# Patient Record
Sex: Male | Born: 1935 | Race: White | Hispanic: No | Marital: Married | Smoking: Former smoker
Health system: Southern US, Community
[De-identification: ages and names within clinical notes are randomized; demographics above are authoritative.]

## PROBLEM LIST (undated history)

## (undated) DIAGNOSIS — C679 Malignant neoplasm of bladder, unspecified: Secondary | ICD-10-CM

## (undated) DIAGNOSIS — M810 Age-related osteoporosis without current pathological fracture: Secondary | ICD-10-CM

## (undated) DIAGNOSIS — R011 Cardiac murmur, unspecified: Secondary | ICD-10-CM

## (undated) DIAGNOSIS — M21371 Foot drop, right foot: Secondary | ICD-10-CM

## (undated) DIAGNOSIS — I10 Essential (primary) hypertension: Secondary | ICD-10-CM

## (undated) DIAGNOSIS — E7800 Pure hypercholesterolemia, unspecified: Secondary | ICD-10-CM

## (undated) DIAGNOSIS — C6290 Malignant neoplasm of unspecified testis, unspecified whether descended or undescended: Secondary | ICD-10-CM

## (undated) DIAGNOSIS — N189 Chronic kidney disease, unspecified: Secondary | ICD-10-CM

## (undated) HISTORY — PX: TONSILLECTOMY: SUR1361

## (undated) HISTORY — PX: OTHER SURGICAL HISTORY: SHX169

## (undated) HISTORY — PX: CATARACT EXTRACTION: SUR2

---

## 1963-02-26 HISTORY — PX: ORCHIECTOMY: SHX2116

## 2004-02-26 HISTORY — PX: OTHER SURGICAL HISTORY: SHX169

## 2005-11-18 ENCOUNTER — Encounter: Payer: Self-pay | Admitting: Endocrinology

## 2005-11-19 ENCOUNTER — Encounter: Payer: Self-pay | Admitting: Endocrinology

## 2005-12-16 ENCOUNTER — Encounter: Payer: Self-pay | Admitting: Endocrinology

## 2006-05-26 ENCOUNTER — Encounter: Payer: Self-pay | Admitting: Endocrinology

## 2006-06-17 ENCOUNTER — Encounter: Payer: Self-pay | Admitting: Endocrinology

## 2006-06-25 ENCOUNTER — Encounter: Payer: Self-pay | Admitting: Endocrinology

## 2007-01-13 ENCOUNTER — Encounter: Payer: Self-pay | Admitting: Endocrinology

## 2007-03-31 ENCOUNTER — Encounter: Payer: Self-pay | Admitting: Endocrinology

## 2007-05-28 ENCOUNTER — Ambulatory Visit: Payer: Self-pay | Admitting: Endocrinology

## 2007-05-28 DIAGNOSIS — I739 Peripheral vascular disease, unspecified: Secondary | ICD-10-CM | POA: Insufficient documentation

## 2007-05-28 DIAGNOSIS — S7290XA Unspecified fracture of unspecified femur, initial encounter for closed fracture: Secondary | ICD-10-CM | POA: Insufficient documentation

## 2007-05-28 DIAGNOSIS — I10 Essential (primary) hypertension: Secondary | ICD-10-CM | POA: Insufficient documentation

## 2007-05-28 DIAGNOSIS — N259 Disorder resulting from impaired renal tubular function, unspecified: Secondary | ICD-10-CM | POA: Insufficient documentation

## 2007-05-28 DIAGNOSIS — C6290 Malignant neoplasm of unspecified testis, unspecified whether descended or undescended: Secondary | ICD-10-CM | POA: Insufficient documentation

## 2007-05-28 DIAGNOSIS — M109 Gout, unspecified: Secondary | ICD-10-CM

## 2007-05-28 DIAGNOSIS — E785 Hyperlipidemia, unspecified: Secondary | ICD-10-CM

## 2007-06-17 ENCOUNTER — Ambulatory Visit: Payer: Self-pay

## 2007-06-18 ENCOUNTER — Ambulatory Visit: Payer: Self-pay | Admitting: Endocrinology

## 2007-06-22 ENCOUNTER — Encounter: Payer: Self-pay | Admitting: Endocrinology

## 2007-07-22 ENCOUNTER — Ambulatory Visit: Payer: Self-pay | Admitting: Endocrinology

## 2007-08-21 ENCOUNTER — Ambulatory Visit: Payer: Self-pay | Admitting: Endocrinology

## 2007-09-08 ENCOUNTER — Ambulatory Visit: Payer: Self-pay | Admitting: Endocrinology

## 2007-10-08 ENCOUNTER — Ambulatory Visit: Payer: Self-pay | Admitting: Endocrinology

## 2007-10-21 ENCOUNTER — Encounter: Payer: Self-pay | Admitting: Endocrinology

## 2007-11-09 ENCOUNTER — Ambulatory Visit: Payer: Self-pay | Admitting: Endocrinology

## 2007-11-25 ENCOUNTER — Encounter: Payer: Self-pay | Admitting: Endocrinology

## 2007-11-30 ENCOUNTER — Telehealth (INDEPENDENT_AMBULATORY_CARE_PROVIDER_SITE_OTHER): Payer: Self-pay | Admitting: *Deleted

## 2007-12-01 ENCOUNTER — Ambulatory Visit: Payer: Self-pay | Admitting: Endocrinology

## 2008-01-08 ENCOUNTER — Ambulatory Visit: Payer: Self-pay | Admitting: Endocrinology

## 2008-02-08 ENCOUNTER — Ambulatory Visit: Payer: Self-pay | Admitting: Endocrinology

## 2008-02-10 ENCOUNTER — Telehealth (INDEPENDENT_AMBULATORY_CARE_PROVIDER_SITE_OTHER): Payer: Self-pay | Admitting: *Deleted

## 2008-03-22 ENCOUNTER — Ambulatory Visit: Payer: Self-pay | Admitting: Endocrinology

## 2008-03-29 LAB — CONVERTED CEMR LAB: Hgb A1c MFr Bld: 8.1 % — ABNORMAL HIGH (ref 4.6–6.0)

## 2008-04-19 ENCOUNTER — Ambulatory Visit: Payer: Self-pay | Admitting: Endocrinology

## 2008-04-28 ENCOUNTER — Encounter: Payer: Self-pay | Admitting: Internal Medicine

## 2008-05-09 ENCOUNTER — Encounter: Payer: Self-pay | Admitting: Endocrinology

## 2008-05-12 ENCOUNTER — Ambulatory Visit: Payer: Self-pay | Admitting: Endocrinology

## 2008-07-14 ENCOUNTER — Ambulatory Visit: Payer: Self-pay | Admitting: Endocrinology

## 2008-07-14 LAB — CONVERTED CEMR LAB: Hgb A1c MFr Bld: 6.4 % (ref 4.6–6.5)

## 2008-10-26 ENCOUNTER — Ambulatory Visit: Payer: Self-pay | Admitting: Endocrinology

## 2008-10-26 DIAGNOSIS — E1029 Type 1 diabetes mellitus with other diabetic kidney complication: Secondary | ICD-10-CM | POA: Insufficient documentation

## 2008-10-26 LAB — CONVERTED CEMR LAB
Creatinine,U: 113.3 mg/dL
Hgb A1c MFr Bld: 6.2 % (ref 4.6–6.5)
Microalb Creat Ratio: 149.2 mg/g — ABNORMAL HIGH (ref 0.0–30.0)
Microalb, Ur: 16.9 mg/dL — ABNORMAL HIGH (ref 0.0–1.9)

## 2008-11-09 ENCOUNTER — Encounter: Payer: Self-pay | Admitting: Endocrinology

## 2009-01-25 ENCOUNTER — Ambulatory Visit: Payer: Self-pay | Admitting: Endocrinology

## 2009-01-25 LAB — CONVERTED CEMR LAB: Hgb A1c MFr Bld: 6.1 % (ref 4.6–6.5)

## 2009-04-17 ENCOUNTER — Encounter: Payer: Self-pay | Admitting: Endocrinology

## 2009-04-21 ENCOUNTER — Telehealth: Payer: Self-pay | Admitting: Endocrinology

## 2009-04-25 ENCOUNTER — Encounter: Payer: Self-pay | Admitting: Endocrinology

## 2009-04-27 ENCOUNTER — Ambulatory Visit: Payer: Self-pay | Admitting: Endocrinology

## 2009-04-28 ENCOUNTER — Encounter: Payer: Self-pay | Admitting: Endocrinology

## 2009-05-09 ENCOUNTER — Encounter: Payer: Self-pay | Admitting: Endocrinology

## 2009-05-22 ENCOUNTER — Encounter (INDEPENDENT_AMBULATORY_CARE_PROVIDER_SITE_OTHER): Payer: Self-pay | Admitting: *Deleted

## 2009-05-22 ENCOUNTER — Encounter: Payer: Self-pay | Admitting: Endocrinology

## 2009-05-22 LAB — CONVERTED CEMR LAB
BUN: 57 mg/dL
Basophils Relative: 0 %
CO2: 30 meq/L
Calcium: 9.6 mg/dL
Chloride: 104 meq/L
Cholesterol: 155 mg/dL
Creatinine, Ser: 2.1 mg/dL
Direct LDL: 60 mg/dL
Eosinophils Relative: 6.2 %
GFR calc Af Amer: 37.75 mL/min
GFR calc non Af Amer: 31.2 mL/min
Glucose, Bld: 188 mg/dL
HCT: 41.6 %
HDL: 74 mg/dL
Hemoglobin: 13.4 g/dL
Lymphocytes, automated: 11.1 %
MCV: 97.4 fL
Monocytes Relative: 5.5 %
Neutrophils Relative %: 76.8 %
Platelets: 145 10*3/uL
Potassium: 5 meq/L
RBC: 4.27 M/uL
RDW: 13.6 %
Sodium: 141 meq/L
Triglyceride fasting, serum: 115 mg/dL
WBC: 9.1 10*3/uL

## 2009-05-23 ENCOUNTER — Encounter (INDEPENDENT_AMBULATORY_CARE_PROVIDER_SITE_OTHER): Payer: Self-pay | Admitting: *Deleted

## 2009-05-31 ENCOUNTER — Ambulatory Visit: Payer: Self-pay | Admitting: Endocrinology

## 2009-05-31 LAB — CONVERTED CEMR LAB: Hgb A1c MFr Bld: 6.4 % (ref 4.6–6.5)

## 2009-07-25 ENCOUNTER — Encounter: Payer: Self-pay | Admitting: Endocrinology

## 2009-08-26 ENCOUNTER — Emergency Department (HOSPITAL_COMMUNITY): Admission: EM | Admit: 2009-08-26 | Discharge: 2009-08-26 | Payer: Self-pay | Admitting: Emergency Medicine

## 2009-09-28 ENCOUNTER — Ambulatory Visit: Payer: Self-pay | Admitting: Endocrinology

## 2009-09-28 LAB — CONVERTED CEMR LAB: Hgb A1c MFr Bld: 6.4 % (ref 4.6–6.5)

## 2010-01-30 ENCOUNTER — Ambulatory Visit: Payer: Self-pay | Admitting: Endocrinology

## 2010-01-30 LAB — CONVERTED CEMR LAB: Hgb A1c MFr Bld: 7.3 % — ABNORMAL HIGH (ref 4.6–6.5)

## 2010-02-27 ENCOUNTER — Encounter (INDEPENDENT_AMBULATORY_CARE_PROVIDER_SITE_OTHER): Payer: Self-pay | Admitting: Urology

## 2010-02-27 ENCOUNTER — Ambulatory Visit (HOSPITAL_COMMUNITY)
Admission: RE | Admit: 2010-02-27 | Discharge: 2010-02-28 | Payer: Self-pay | Source: Home / Self Care | Attending: Urology | Admitting: Urology

## 2010-02-28 LAB — BASIC METABOLIC PANEL
BUN: 91 mg/dL — ABNORMAL HIGH (ref 6–23)
CO2: 29 mEq/L (ref 19–32)
Calcium: 9 mg/dL (ref 8.4–10.5)
Chloride: 101 mEq/L (ref 96–112)
Creatinine, Ser: 2.93 mg/dL — ABNORMAL HIGH (ref 0.4–1.5)
GFR calc Af Amer: 26 mL/min — ABNORMAL LOW (ref >60)
GFR calc non Af Amer: 21 mL/min — ABNORMAL LOW (ref >60)
Glucose, Bld: 286 mg/dL — ABNORMAL HIGH (ref 70–99)
Potassium: 5 mEq/L (ref 3.5–5.1)
Sodium: 138 mEq/L (ref 135–145)

## 2010-02-28 LAB — CBC
HCT: 27.2 % — ABNORMAL LOW (ref 39.0–52.0)
Hemoglobin: 9.1 g/dL — ABNORMAL LOW (ref 13.0–17.0)
MCH: 32.6 pg (ref 26.0–34.0)
MCHC: 33.5 g/dL (ref 30.0–36.0)
MCV: 97.5 fL (ref 78.0–100.0)
Platelets: 93 10*3/uL — ABNORMAL LOW (ref 150–400)
RBC: 2.79 MIL/uL — ABNORMAL LOW (ref 4.22–5.81)
RDW: 13.9 % (ref 11.5–15.5)
WBC: 9.4 10*3/uL (ref 4.0–10.5)

## 2010-02-28 LAB — GLUCOSE, CAPILLARY: Glucose-Capillary: 253 mg/dL — ABNORMAL HIGH (ref 70–99)

## 2010-03-27 NOTE — Medication Information (Signed)
Summary: Humalog Approved/[employer]  Humalog Approved/[employer]   Imported By: Sherian Rein 05/08/2009 14:17:48  _____________________________________________________________________  External Attachment:    Type:   Image     Comment:   External Document

## 2010-03-27 NOTE — Assessment & Plan Note (Signed)
Summary: 4 MTH FU  STC   Vital Signs:  Patient profile:   76 year old male Height:      72 inches (182.88 cm) Weight:      146.13 pounds (66.42 kg) O2 Sat:      97 % on Room air Temp:     96.7 degrees F (35.94 degrees C) oral Pulse rate:   66 / minute BP sitting:   172 / 78  (left arm) Cuff size:   regular  Vitals Entered By: Josph Macho RMA (May 31, 2009 10:04 AM)  O2 Flow:  Room air CC: 4 month follow up/ pt states he was taking Amlodipine for BP but quit taking it/ CF Is Patient Diabetic? Yes   Referring Provider:  Hyacinth Meeker Primary Provider:  Sigmund Hazel, eagle  CC:  4 month follow up/ pt states he was taking Amlodipine for BP but quit taking it/ CF.  History of Present Illness: pt states he feels well in general.  he brings a record of his cbg's which i have reviewed today.  all are in the low to mid-100's.    Current Medications (verified): 1)  Atenolol 100 Mg  Tabs (Atenolol) .... Take 1 By Mouth Qd 2)  Adult Aspirin Low Strength 81 Mg  Tbdp (Aspirin) .... Take 1 By Mouth Qd 3)  Allopurinol 100 Mg  Tabs (Allopurinol) .... Take 1 By Mouth Qd 4)  Calcitriol 0.25 Mcg  Caps (Calcitriol) .... Take 1 By Mouth Qd 5)  Vitamin B-12 1000 Mcg  Tabs (Cyanocobalamin) .... Take 1 By Mouth Qd 6)  Pen Needles, and Brand and Size .... Tid 7)  Simvastatin 20 Mg  Tabs (Simvastatin) .... Take 1 By Mouth Qd 8)  Humalog 100 Unit/ml Soln (Insulin Lispro (Human)) .... Inject 17-6-18units Three Times A Day  Allergies (verified): No Known Drug Allergies  Past History:  Past Medical History: Last updated: 05/28/2007 Diabetes mellitus, type I Gout Hyperlipidemia Hypertension Renal insufficiency  Review of Systems  The patient denies hypoglycemia.    Physical Exam  General:  normal appearance.   Pulses:  dorsalis pedis intact bilat.    Extremities:  no deformity.  no ulcer on the feet.  feet are of normal color and temp.  no edema.  Neurologic:  sensation is intact to  touch on the feet.  Additional Exam:  Hemoglobin A1C            6.4 %    Impression & Recommendations:  Problem # 1:  DIABETES MELLITUS, TYPE I, WITH RENAL COMPLICATIONS (ICD-250.41) still overcontrolled  Medications Added to Medication List This Visit: 1)  Humalog 100 Unit/ml Soln (Insulin lispro (human)) .... Inject 16-5-17units three times a day  Other Orders: TLB-A1C / Hgb A1C (Glycohemoglobin) (83036-A1C) Est. Patient Level III (21308)  Patient Instructions: 1)  Please schedule a follow-up appointment in 4 months. 2)  tests are being ordered for you today.  a few days after the test(s), please call [phone] to hear your test results.  3)  pending the test results, please continue humalog to (just before each meal) 17-6-18 units).   4)  (update: i left message on phone-tree:  reduce humalog to (just before each meal), 16-5-17 units)

## 2010-03-27 NOTE — Miscellaneous (Signed)
Summary: labs  Clinical Lists Changes  Observations: Added new observation of LDL DIR: 60 mg/dL (16/11/9602 54:09) Added new observation of HDL: 74 mg/dL (81/19/1478 29:56) Added new observation of TRIGLYCERIDE: 115 mg/dL (21/30/8657 84:69) Added new observation of CHOLESTEROL: 155 mg/dL (62/95/2841 32:44) Added new observation of GFRAA: 37.75 mL/min (05/22/2009 16:30) Added new observation of GFR: 31.20 mL/min (05/22/2009 16:30) Added new observation of CALCIUM: 9.6 mg/dL (02/27/7251 66:44) Added new observation of CO2 PLSM/SER: 30 meq/L (05/22/2009 16:30) Added new observation of CL SERUM: 104 meq/L (05/22/2009 16:30) Added new observation of K SERUM: 5.0 meq/L (05/22/2009 16:30) Added new observation of NA: 141 meq/L (05/22/2009 16:30) Added new observation of CREATININE: 2.10 mg/dL (03/47/4259 56:38) Added new observation of BUN: 57 mg/dL (75/64/3329 51:88) Added new observation of BG RANDOM: 188 mg/dL (41/66/0630 16:01) Added new observation of BASOPHIL %: 0.00 % (05/22/2009 16:29) Added new observation of % EOS AUTO: 6.20 % (05/22/2009 16:29) Added new observation of MONOCYTE %: 5.5 % (05/22/2009 16:29) Added new observation of LYMPH %: 11.1 % (05/22/2009 16:29) Added new observation of PMN %: 76.8 % (05/22/2009 16:29) Added new observation of RDW: 13.6 % (05/22/2009 16:29) Added new observation of MCV: 97.4 fL (05/22/2009 16:29) Added new observation of PLATELETK/UL: 145 K/uL (05/22/2009 16:29) Added new observation of RBC M/UL: 4.27 M/uL (05/22/2009 16:29) Added new observation of HCT: 41.6 % (05/22/2009 16:29) Added new observation of HGB: 13.4 g/dL (09/32/3557 32:20) Added new observation of WBC COUNT: 9.1 10*3/microliter (05/22/2009 16:29)      -  Date:  05/22/2009    WBC: 9.1    HGB: 13.4    HCT: 41.6    RBC: 4.27    PLT: 145    MCV: 97.4    RDW: 13.6    Neutrophil: 76.8    Lymphs: 11.1    Monos: 5.5    Eos: 6.20    Basophil: 0.00    BG Random: 188  BUN: 57    Creatinine: 2.10    Sodium: 141    Potassium: 5.0    Chloride: 104    CO2 Total: 30    Calcium: 9.6    GFR(Non African American): 31.20    GFR(African American): 37.75    Total Chol: 155    TG: 115    HDL: 74    Direct LDL: 60

## 2010-03-27 NOTE — Assessment & Plan Note (Signed)
Summary: 4 MO ROV /NWS  #   Vital Signs:  Patient profile:   76 year old male Height:      72 inches (182.88 cm) Weight:      138 pounds (62.73 kg) BMI:     18.78 O2 Sat:      97 % on Room air Temp:     98.6 degrees F (37.00 degrees C) oral Pulse rate:   69 / minute Pulse rhythm:   regular BP sitting:   124 / 62  (left arm) Cuff size:   regular  Vitals Entered By: Brenton Grills CMA Duncan Dull) (January 30, 2010 9:58 AM)  O2 Flow:  Room air CC: 4 month F/U/aj Is Patient Diabetic? Yes   Referring Provider:  Hyacinth Meeker Primary Provider:  Sigmund Hazel, eagle  CC:  4 month F/U/aj.  History of Present Illness: pt is worried that he continues to lost weight.  he brings a record of his cbg's which i have reviewed today.  most are in the low to mid-100's.  it is highest in am (sometimes over 200), although it is sometimes below 100 in am (90).  Current Medications (verified): 1)  Adult Aspirin Low Strength 81 Mg  Tbdp (Aspirin) .... Take 1 By Mouth Qd 2)  Allopurinol 100 Mg  Tabs (Allopurinol) .... Take 1 By Mouth Qd 3)  Calcitriol 0.25 Mcg  Caps (Calcitriol) .... Take 1 By Mouth Qd 4)  Vitamin B-12 1000 Mcg  Tabs (Cyanocobalamin) .... Take 1 By Mouth Qd 5)  Pen Needles, and Brand and Size .... Tid 6)  Simvastatin 20 Mg  Tabs (Simvastatin) .... Take 1 By Mouth Qd 7)  Humalog Kwikpen 100 Unit/ml Soln (Insulin Lispro (Human)) .... Three Times A Day (Just Before Each Meal), 14-5-17 Units. 8)  Amlodipine Besylate 10 Mg Tabs (Amlodipine Besylate) .Marland Kitchen.. 1 Tablet Once Daily 9)  Metoprolol Tartrate 100 Mg Tabs (Metoprolol Tartrate) .... Take 1 By Mouth Once Daily 10)  Furosemide 40 Mg Tabs (Furosemide) .Marland Kitchen.. 1 Tablet By Mouth Once Daily  Allergies (verified): No Known Drug Allergies  Past History:  Past Medical History: Last updated: 05/28/2007 Diabetes mellitus, type I Gout Hyperlipidemia Hypertension Renal insufficiency  Review of Systems  The patient denies hypoglycemia.     Physical Exam  General:  normal appearance.   Pulses:  dorsalis pedis intact bilat.    Extremities:  no deformity.  no ulcer on the feet.  feet are of normal color and temp.   1+ right pedal edema and trace left pedal edema.   Neurologic:  sensation is intact to touch on the feet, but decreased from normal on the right foot.    Additional Exam:  Hemoglobin A1C       [H]  7.3 %    Impression & Recommendations:  Problem # 1:  DIABETES MELLITUS, TYPE I, WITH RENAL COMPLICATIONS (ICD-250.41) the pattern of his cbg's suggests he is losing basal insulin function  Medications Added to Medication List This Visit: 1)  Furosemide 40 Mg Tabs (Furosemide) .Marland Kitchen.. 1 tablet by mouth once daily  Other Orders: TLB-A1C / Hgb A1C (Glycohemoglobin) (83036-A1C) Est. Patient Level III (16109)  Patient Instructions: 1)  Please schedule a follow-up appointment in 4 months. 2)  tests are being ordered for you today.  please call [phone] to hear your test results.  3)  pending the test results, continue humalog (just before each meal), 14-5-17 units).  also, take 2 units of humalog at bedtime, if you eat a snack  then. 4)  please see dr Hyacinth Meeker to see why you continue to lose weight.   5)  (update: i left message on phone-tree:  rx as we discussed.  same insulin for now) Prescriptions: HUMALOG KWIKPEN 100 UNIT/ML SOLN (INSULIN LISPRO (HUMAN)) three times a day (just before each meal), 14-5-17 units.  #1box x 3   Entered and Authorized by:   Minus Breeding MD   Signed by:   Minus Breeding MD on 01/30/2010   Method used:   Print then Give to Patient   RxID:   [id number] HUMALOG KWIKPEN 100 UNIT/ML SOLN (INSULIN LISPRO (HUMAN)) three times a day (just before each meal), 14-5-17 units.  #1box x 3   Entered and Authorized by:   Minus Breeding MD   Signed by:   Minus Breeding MD on 01/30/2010   Method used:   Print then Give to Patient   RxID:   [id number]    Orders Added: 1)  TLB-A1C / Hgb  A1C (Glycohemoglobin) [83036-A1C] 2)  Est. Patient Level III [84696]

## 2010-03-27 NOTE — Letter (Signed)
Summary: [facility]   Imported By: Sherian Rein 05/17/2009 10:26:53  _____________________________________________________________________  External Attachment:    Type:   Image     Comment:   External Document

## 2010-03-27 NOTE — Assessment & Plan Note (Signed)
Summary: PER PT 4 MTH FU  STC   Vital Signs:  Patient profile:   76 year old male Height:      72 inches (182.88 cm) Weight:      142.38 pounds (64.72 kg) BMI:     19.38 O2 Sat:      96 % on Room air Temp:     97.1 degrees F (36.17 degrees C) oral Pulse rate:   61 / minute BP sitting:   130 / 66  (left arm) Cuff size:   regular  Vitals Entered By: Brenton Grills MA (September 28, 2009 9:46 AM)  O2 Flow:  Room air CC: 4 mo F/U/pt states he is not currently taking Atenolol/aj Is Patient Diabetic? Yes   Referring Hykeem Ojeda:  Hyacinth Meeker Primary Marsha Gundlach:  Sigmund Hazel, eagle  CC:  4 mo F/U/pt states he is not currently taking Atenolol/aj.  History of Present Illness: pt states he feels well in general, except for a recent fall.  he brings a record of his cbg's which i have reviewed today.  all are in the 100's, except occasional 50-60, before lunch.    Current Medications (verified): 1)  Atenolol 100 Mg  Tabs (Atenolol) .... Take 1 By Mouth Qd 2)  Adult Aspirin Low Strength 81 Mg  Tbdp (Aspirin) .... Take 1 By Mouth Qd 3)  Allopurinol 100 Mg  Tabs (Allopurinol) .... Take 1 By Mouth Qd 4)  Calcitriol 0.25 Mcg  Caps (Calcitriol) .... Take 1 By Mouth Qd 5)  Vitamin B-12 1000 Mcg  Tabs (Cyanocobalamin) .... Take 1 By Mouth Qd 6)  Pen Needles, and Brand and Size .... Tid 7)  Simvastatin 20 Mg  Tabs (Simvastatin) .... Take 1 By Mouth Qd 8)  Humalog Kwikpen 100 Unit/ml Soln (Insulin Lispro (Human)) .Marland Kitchen.. 16-5-17 9)  Amlodipine Besylate 10 Mg Tabs (Amlodipine Besylate) .Marland Kitchen.. 1 Tablet Once Daily 10)  Metoprolol Tartrate 100 Mg Tabs (Metoprolol Tartrate) .... Take 1 By Mouth Once Daily  Allergies (verified): No Known Drug Allergies  Past History:  Past Medical History: Last updated: 05/28/2007 Diabetes mellitus, type I Gout Hyperlipidemia Hypertension Renal insufficiency  Review of Systems  The patient denies syncope.    Physical Exam  General:  normal appearance.   Skin:   insulin injection sites at anterior abdomen are normal    Impression & Recommendations:  Problem # 1:  DIABETES MELLITUS, TYPE I, WITH RENAL COMPLICATIONS (ICD-250.41) overcontrolled  Medications Added to Medication List This Visit: 1)  Humalog Kwikpen 100 Unit/ml Soln (Insulin lispro (human)) .... Three times a day (just before each meal), 15-5-17 units. 2)  Humalog Kwikpen 100 Unit/ml Soln (Insulin lispro (human)) .... Three times a day (just before each meal), 14-5-17 units. 3)  Amlodipine Besylate 10 Mg Tabs (Amlodipine besylate) .Marland Kitchen.. 1 tablet once daily 4)  Metoprolol Tartrate 100 Mg Tabs (Metoprolol tartrate) .... Take 1 by mouth once daily  Other Orders: TLB-A1C / Hgb A1C (Glycohemoglobin) (83036-A1C) Est. Patient Level III (16109)  Patient Instructions: 1)  Please schedule a follow-up appointment in 4 months. 2)  tests are being ordered for you today.  a few days after the test(s), please call [phone] to hear your test results.  3)  pending the test results, reduce humalog (just before each meal), 15-5-17 units). 4)  (update: i left message on phone-tree:  reduce breakfast humalog to 14 units).

## 2010-03-27 NOTE — Miscellaneous (Signed)
  Clinical Lists Changes  Medications: Added new medication of HUMALOG KWIKPEN 100 UNIT/ML SOLN (INSULIN LISPRO (HUMAN)) 16-5-17 - Signed Rx of HUMALOG KWIKPEN 100 UNIT/ML SOLN (INSULIN LISPRO (HUMAN)) 16-5-17;  #1 month x 3;  Signed;  Entered by: Josph Macho RMA;  Authorized by: Minus Breeding MD;  Method used: Electronically to [facility] [phone]*, [address] [place], [place]  [address], Ph: [phone], Fax: [phone]    Prescriptions: [name] 100 UNIT/ML SOLN (INSULIN LISPRO (HUMAN)) 16-5-17  #1 month x 3   Entered by:   Josph Macho RMA   Authorized by:   Minus Breeding MD   Signed by:   Josph Macho RMA on 07/25/2009   Method used:   Electronically to        [facility] [phone]* (retail)       [address]       [place], [place]  [address]       Ph: [phone]       Fax: [phone]   RxID:   [id number]

## 2010-03-27 NOTE — Progress Notes (Signed)
Summary: [name]  Phone Note Call from Patient Call back at Home Phone [phone]   Caller: Patient Call For: Minus Breeding MD Summary of Call: Pt's wife called and states we need to call [phone], [employer] [place]. Humalog 100 units kwik pen. This needs prior auithorization. Pt has just enough for 1 month. Initial call taken by: Verdell Face,  April 21, 2009 10:58 AM  Follow-up for Phone Call        Is there a reason why patient cannot use vial? I am certain this will be a determining factor. Please advise. Follow-up by: Lucious Groves,  April 21, 2009 11:38 AM  Additional Follow-up for Phone Call Additional follow up Details #1::        i am unaware of any reason why pt cannot use the vial.  please call the #.  if vial is ok, i'll change to the vial. Additional Follow-up by: Minus Breeding MD,  April 21, 2009 11:45 AM    Additional Follow-up for Phone Call Additional follow up Details #2::    ID:  [phone] I spoke with patient insurance company and vial is preferred but requires PA also. They will fax the forms.Lucious Groves,  April 24, 2009 2:56 PM  PA form completed for Humalog Janace Aris  April 25, 2009 11:32 AM   Additional Follow-up for Phone Call Additional follow up Details #3:: Details for Additional Follow-up Action Taken: form is done Additional Follow-up by: Minus Breeding MD,  April 25, 2009 3:35 PM  New/Updated Medications: HUMALOG 100 UNIT/ML SOLN (INSULIN LISPRO (HUMAN)) inject 17-6-18units three times a day Prescriptions: HUMALOG 100 UNIT/ML SOLN (INSULIN LISPRO (HUMAN)) inject 17-6-18units three times a day  #69month x 4   Entered and Authorized by:   Minus Breeding MD   Signed by:   Minus Breeding MD on 04/25/2009   Method used:   Historical   RxID:   [id number]   Appended Document: Dylan Coup PA completed, will wait for insurance company reply.  Appended Document: Kwikpen Humalog approved "pen" until 2012.

## 2010-03-27 NOTE — Medication Information (Signed)
Summary: Prior Authorization/[employer] [place]  Prior Authorization/[employer] [place]   Imported By: Lester Bellflower 04/28/2009 11:00:42  _____________________________________________________________________  External Attachment:    Type:   Image     Comment:   External Document

## 2010-03-27 NOTE — Medication Information (Signed)
Summary: Humalog/[employer]  Humalog/[employer]   Imported By: Sherian Rein 05/01/2009 08:52:18  _____________________________________________________________________  External Attachment:    Type:   Image     Comment:   External Document

## 2010-04-19 ENCOUNTER — Other Ambulatory Visit: Payer: Self-pay | Admitting: Urology

## 2010-04-19 ENCOUNTER — Encounter (HOSPITAL_COMMUNITY): Payer: Medicare Other | Attending: Urology

## 2010-04-19 DIAGNOSIS — Z01812 Encounter for preprocedural laboratory examination: Secondary | ICD-10-CM | POA: Insufficient documentation

## 2010-04-19 LAB — CBC
HCT: 33.8 % — ABNORMAL LOW (ref 39.0–52.0)
Hemoglobin: 11 g/dL — ABNORMAL LOW (ref 13.0–17.0)
MCH: 31.5 pg (ref 26.0–34.0)
MCHC: 32.5 g/dL (ref 30.0–36.0)
MCV: 96.8 fL (ref 78.0–100.0)
Platelets: 140 10*3/uL — ABNORMAL LOW (ref 150–400)
RBC: 3.49 MIL/uL — ABNORMAL LOW (ref 4.22–5.81)
RDW: 13.8 % (ref 11.5–15.5)
WBC: 9.7 10*3/uL (ref 4.0–10.5)

## 2010-04-19 LAB — BASIC METABOLIC PANEL
BUN: 108 mg/dL — ABNORMAL HIGH (ref 6–23)
CO2: 26 mEq/L (ref 19–32)
Calcium: 9.8 mg/dL (ref 8.4–10.5)
Chloride: 105 mEq/L (ref 96–112)
Creatinine, Ser: 2.77 mg/dL — ABNORMAL HIGH (ref 0.4–1.5)
GFR calc Af Amer: 27 mL/min — ABNORMAL LOW (ref >60)
GFR calc non Af Amer: 23 mL/min — ABNORMAL LOW (ref >60)
Glucose, Bld: 226 mg/dL — ABNORMAL HIGH (ref 70–99)
Potassium: 4.9 mEq/L (ref 3.5–5.1)
Sodium: 137 mEq/L (ref 135–145)

## 2010-04-19 LAB — SURGICAL PCR SCREEN
MRSA, PCR: NEGATIVE
Staphylococcus aureus: NEGATIVE

## 2010-04-24 ENCOUNTER — Ambulatory Visit (HOSPITAL_COMMUNITY)
Admission: RE | Admit: 2010-04-24 | Discharge: 2010-04-24 | Disposition: A | Discharge status: Home / Self Care | Payer: Medicare Other | Source: Ambulatory Visit | Attending: Urology | Admitting: Urology

## 2010-04-24 ENCOUNTER — Other Ambulatory Visit: Payer: Self-pay | Admitting: Urology

## 2010-04-24 DIAGNOSIS — C676 Malignant neoplasm of ureteric orifice: Secondary | ICD-10-CM | POA: Insufficient documentation

## 2010-04-24 DIAGNOSIS — M109 Gout, unspecified: Secondary | ICD-10-CM | POA: Insufficient documentation

## 2010-04-24 DIAGNOSIS — I129 Hypertensive chronic kidney disease with stage 1 through stage 4 chronic kidney disease, or unspecified chronic kidney disease: Secondary | ICD-10-CM | POA: Insufficient documentation

## 2010-04-24 DIAGNOSIS — C670 Malignant neoplasm of trigone of bladder: Secondary | ICD-10-CM | POA: Insufficient documentation

## 2010-04-24 DIAGNOSIS — N183 Chronic kidney disease, stage 3 unspecified: Secondary | ICD-10-CM | POA: Insufficient documentation

## 2010-04-24 DIAGNOSIS — E109 Type 1 diabetes mellitus without complications: Secondary | ICD-10-CM | POA: Insufficient documentation

## 2010-04-24 LAB — GLUCOSE, CAPILLARY
Glucose-Capillary: 181 mg/dL — ABNORMAL HIGH (ref 70–99)
Glucose-Capillary: 177 mg/dL — ABNORMAL HIGH (ref 70–99)
Glucose-Capillary: 177 mg/dL — ABNORMAL HIGH (ref 70–99)

## 2010-04-25 NOTE — Op Note (Signed)
[name], [name]             ACCOUNT NO.:  [id number]  MEDICAL RECORD NO.:  [id number]           PATIENT TYPE:  O  LOCATION:  DAYL                         FACILITY[facility]  [facility]  PHYSICIAN:  Courtney Paris, M.D.DATE OF BIRTH:  08/30/1934  DATE OF PROCEDURE:  04/24/2010 DATE OF DISCHARGE:                              OPERATIVE REPORT   PREOPERATIVE DIAGNOSIS:  Previous invasive high-grade bladder cancer.  POSTOPERATIVE DIAGNOSIS:  Previous invasive high-grade bladder cancer.  OPERATION:  Transurethral resection of bladder tumor bladder base, right trigone, left orifice (3 cm).  SURGEON:  Courtney Paris, M.D.  ANESTHESIA:  General.  BRIEF HISTORY:  This is a 76 year old patient with chronic stage 3 renal disease who was referred by Dr. Sigmund Hazel for hematuria in November. He had high-grade superficially invasive bladder cancer on the right base particularly resected February 27, 2010.  He was kept in the hospitalovernight with continuous bladder irrigation because of his chronic renal disease.  He did fine with this and comes in now for re-resection to see if the cancer is still present.  He did have superficially invasive high-grade bladder cancer on the previous pathology.  He has done fine since his resection and enters now to have this done again.  He did receive mitomycin C postoperatively the first time.  PROCEDURE IN DETAIL:  The patient was placed on the operating table in the dorsal lithotomy position after satisfactory induction of general anesthesia.  He was prepped and draped with Betadine in the usual sterile fashion.  He was given IV Cipro.  A time-out was then performed and the patient and the procedure were then reconfirmed.  The urethra had to be dilated with Sissy Hoff sounds #20 through #28 because of a slight meatal stenosis.  The #21 panendoscope was passed down the anterior urethra.  No strictures were seen.  Posterior urethra showed mild  prostate enlargement in a bilobar fashion.  The bladder was entered.  It was carefully inspected using the right-angle and the 4 black lens.  Pictures were made of the right trigone where most of the previous cancer was and there seemed to be a little papillary tumor coming from the left ureteral orifice as well.  After these pictures were made, the cold cup biopsy forceps were used to first biopsy the left ureteral orifice, the left trigone and then the right trigone, taking care not to injure the right orifice.  There was another area in the posterior base that was fulgurated.  The areas were fulgurated to effect good hemostasis.  I did not see the need to leave a Foley catheter because hemostasis was adequate.  I removed the cystoscope after draining the bladder.  I inserted a B and O suppository and some lidocaine jelly in his urethra.  He will be sent to the outpatient recovery area where he will be required to void before he leaves, but I do not think he will need a catheter at this time.  He will come back in a week or so after we know the pathology report for further treatment disposition.  He tolerated the procedure well.  Courtney Paris, M.D.     HMK/MEDQ  D:  04/24/2010  T:  04/24/2010  Job:  [id number]  Electronically Signed by Vic Blackbird M.D. on 04/25/2010 07:58:17 AM

## 2010-05-07 LAB — GLUCOSE, CAPILLARY
Glucose-Capillary: 163 mg/dL — ABNORMAL HIGH (ref 70–99)
Glucose-Capillary: 205 mg/dL — ABNORMAL HIGH (ref 70–99)
Glucose-Capillary: 223 mg/dL — ABNORMAL HIGH (ref 70–99)
Glucose-Capillary: 296 mg/dL — ABNORMAL HIGH (ref 70–99)

## 2010-05-07 LAB — COMPREHENSIVE METABOLIC PANEL
ALT: 37 U/L (ref 0–53)
AST: 27 U/L (ref 0–37)
CO2: 30 mEq/L (ref 19–32)
Calcium: 10.1 mg/dL (ref 8.4–10.5)
GFR calc Af Amer: 30 mL/min — ABNORMAL LOW (ref >60)
Potassium: 4.8 mEq/L (ref 3.5–5.1)
Sodium: 139 mEq/L (ref 135–145)
Total Protein: 6.8 g/dL (ref 6.0–8.3)

## 2010-05-07 LAB — CBC
Hemoglobin: 11.3 g/dL — ABNORMAL LOW (ref 13.0–17.0)
MCV: 97.4 fL (ref 78.0–100.0)
Platelets: 141 10*3/uL — ABNORMAL LOW (ref 150–400)
RBC: 3.5 MIL/uL — ABNORMAL LOW (ref 4.22–5.81)
WBC: 10.8 10*3/uL — ABNORMAL HIGH (ref 4.0–10.5)

## 2010-05-07 LAB — HEMOGLOBIN A1C: Mean Plasma Glucose: 163 mg/dL — ABNORMAL HIGH (ref <117)

## 2010-05-15 ENCOUNTER — Ambulatory Visit: Payer: Medicare Other | Attending: Radiation Oncology | Admitting: Radiation Oncology

## 2010-05-15 DIAGNOSIS — E7800 Pure hypercholesterolemia, unspecified: Secondary | ICD-10-CM | POA: Insufficient documentation

## 2010-05-15 DIAGNOSIS — E1142 Type 2 diabetes mellitus with diabetic polyneuropathy: Secondary | ICD-10-CM | POA: Insufficient documentation

## 2010-05-15 DIAGNOSIS — C679 Malignant neoplasm of bladder, unspecified: Secondary | ICD-10-CM | POA: Insufficient documentation

## 2010-05-15 DIAGNOSIS — Z79899 Other long term (current) drug therapy: Secondary | ICD-10-CM | POA: Insufficient documentation

## 2010-05-15 DIAGNOSIS — Z794 Long term (current) use of insulin: Secondary | ICD-10-CM | POA: Insufficient documentation

## 2010-05-15 DIAGNOSIS — I10 Essential (primary) hypertension: Secondary | ICD-10-CM | POA: Insufficient documentation

## 2010-05-15 DIAGNOSIS — E1149 Type 2 diabetes mellitus with other diabetic neurological complication: Secondary | ICD-10-CM | POA: Insufficient documentation

## 2010-05-15 DIAGNOSIS — Z923 Personal history of irradiation: Secondary | ICD-10-CM | POA: Insufficient documentation

## 2010-05-22 ENCOUNTER — Other Ambulatory Visit: Payer: Self-pay | Admitting: Oncology

## 2010-05-22 ENCOUNTER — Encounter (HOSPITAL_BASED_OUTPATIENT_CLINIC_OR_DEPARTMENT_OTHER): Payer: Medicare Other | Admitting: Oncology

## 2010-05-22 DIAGNOSIS — C679 Malignant neoplasm of bladder, unspecified: Secondary | ICD-10-CM

## 2010-05-22 LAB — COMPREHENSIVE METABOLIC PANEL
ALT: 17 U/L (ref 0–53)
Alkaline Phosphatase: 86 U/L (ref 39–117)
Glucose, Bld: 202 mg/dL — ABNORMAL HIGH (ref 70–99)
Sodium: 139 mEq/L (ref 135–145)
Total Bilirubin: 0.5 mg/dL (ref 0.3–1.2)
Total Protein: 7.1 g/dL (ref 6.0–8.3)

## 2010-05-22 LAB — CBC WITH DIFFERENTIAL/PLATELET
BASO%: 0.2 % (ref 0.0–2.0)
LYMPH%: 16.1 % (ref 14.0–49.0)
MCH: 32 pg (ref 27.2–33.4)
MCHC: 34 g/dL (ref 32.0–36.0)
MCV: 94 fL (ref 79.3–98.0)
MONO%: 6.4 % (ref 0.0–14.0)
Platelets: 138 10*3/uL — ABNORMAL LOW (ref 140–400)
RBC: 3.66 10*6/uL — ABNORMAL LOW (ref 4.20–5.82)

## 2010-05-25 ENCOUNTER — Encounter: Payer: Self-pay | Admitting: Endocrinology

## 2010-05-30 ENCOUNTER — Encounter: Payer: Self-pay | Admitting: Endocrinology

## 2010-05-30 ENCOUNTER — Ambulatory Visit (INDEPENDENT_AMBULATORY_CARE_PROVIDER_SITE_OTHER): Payer: Medicare Other | Admitting: Endocrinology

## 2010-05-30 ENCOUNTER — Other Ambulatory Visit (INDEPENDENT_AMBULATORY_CARE_PROVIDER_SITE_OTHER): Payer: Medicare Other

## 2010-05-30 DIAGNOSIS — C679 Malignant neoplasm of bladder, unspecified: Secondary | ICD-10-CM

## 2010-05-30 DIAGNOSIS — E1029 Type 1 diabetes mellitus with other diabetic kidney complication: Secondary | ICD-10-CM

## 2010-05-30 MED ORDER — INSULIN PEN NEEDLE 31G X 8 MM MISC: 1.0000 | Freq: Four times a day (QID) | Status: DC

## 2010-05-30 MED ORDER — INSULIN LISPRO 100 UNIT/ML ~~LOC~~ SOLN: SUBCUTANEOUS | Status: DC

## 2010-05-30 MED ORDER — INSULIN NPH (HUMAN) (ISOPHANE) 100 UNIT/ML ~~LOC~~ SUSP: 2.0000 [IU] | Freq: Every day | SUBCUTANEOUS | Status: DC

## 2010-05-30 NOTE — Progress Notes (Signed)
  Subjective:    Patient ID: [name], male    DOB: 1934/07/17, 76 y.o.   MRN: [id number]  HPI Pt says he has lost weight related to recent dx of bladder cancer.  he brings a record of his cbg's which i have reviewed today. He checks only in am, when it vries from 150-220. No past medical history on file. Past Surgical History  Procedure Date  . Orchiectomy 1965    right    reports that he has quit smoking. He does not have any smokeless tobacco history on file. He reports that he does not drink alcohol or use illicit drugs. family history includes Diabetes in his father and sister. No Known Allergies  Review of Systems denies hypoglycemia.    Objective:   Physical Exam Gen:  Underweight Pulses: dorsalis pedis intact bilat.   Feet: no deformity.  no ulcer on the feet.  feet are of normal color and temp.  Trace bilat leg edema Neuro: sensation is intact to touch on the feet, but decreased from normal.         Assessment & Plan:  Dm.  Based on the pattern of cbg's he reported at last ov, he needs to start hs-insulin.

## 2010-05-30 NOTE — Patient Instructions (Addendum)
check your blood sugar 2 times a day.  vary the time of day when you check, between before the 3 meals, and at bedtime.  also check if you have symptoms of your blood sugar being too high or too low.  please keep a record of the readings and bring it to your next appointment here.  please call us sooner if you are having low blood sugar episodes, or if it stays high in the morning.  blood tests are being ordered for you today.  please call [phone] to hear your test results. pending the test results, please add nph insulin, 2 units at night. Please make a follow-up appointment in 3 months.

## 2010-06-08 ENCOUNTER — Emergency Department (HOSPITAL_COMMUNITY): Payer: Medicare Other

## 2010-06-08 ENCOUNTER — Inpatient Hospital Stay (HOSPITAL_COMMUNITY)
Admission: EM | Admit: 2010-06-08 | Discharge: 2010-06-13 | DRG: 536 | Disposition: A | Discharge status: Skilled Nursing Facility | Payer: Medicare Other | Attending: Internal Medicine | Admitting: Internal Medicine

## 2010-06-08 ENCOUNTER — Encounter (HOSPITAL_BASED_OUTPATIENT_CLINIC_OR_DEPARTMENT_OTHER): Payer: Medicare Other | Admitting: Oncology

## 2010-06-08 DIAGNOSIS — Y9301 Activity, walking, marching and hiking: Secondary | ICD-10-CM

## 2010-06-08 DIAGNOSIS — K5900 Constipation, unspecified: Secondary | ICD-10-CM | POA: Diagnosis not present

## 2010-06-08 DIAGNOSIS — Z8551 Personal history of malignant neoplasm of bladder: Secondary | ICD-10-CM

## 2010-06-08 DIAGNOSIS — C679 Malignant neoplasm of bladder, unspecified: Secondary | ICD-10-CM

## 2010-06-08 DIAGNOSIS — E1149 Type 2 diabetes mellitus with other diabetic neurological complication: Secondary | ICD-10-CM | POA: Diagnosis present

## 2010-06-08 DIAGNOSIS — M109 Gout, unspecified: Secondary | ICD-10-CM | POA: Diagnosis present

## 2010-06-08 DIAGNOSIS — Z66 Do not resuscitate: Secondary | ICD-10-CM | POA: Diagnosis present

## 2010-06-08 DIAGNOSIS — E7800 Pure hypercholesterolemia, unspecified: Secondary | ICD-10-CM | POA: Diagnosis present

## 2010-06-08 DIAGNOSIS — E1142 Type 2 diabetes mellitus with diabetic polyneuropathy: Secondary | ICD-10-CM | POA: Diagnosis present

## 2010-06-08 DIAGNOSIS — D631 Anemia in chronic kidney disease: Secondary | ICD-10-CM | POA: Diagnosis present

## 2010-06-08 DIAGNOSIS — G609 Hereditary and idiopathic neuropathy, unspecified: Secondary | ICD-10-CM | POA: Diagnosis present

## 2010-06-08 DIAGNOSIS — S32409A Unspecified fracture of unspecified acetabulum, initial encounter for closed fracture: Principal | ICD-10-CM | POA: Diagnosis present

## 2010-06-08 DIAGNOSIS — I129 Hypertensive chronic kidney disease with stage 1 through stage 4 chronic kidney disease, or unspecified chronic kidney disease: Secondary | ICD-10-CM | POA: Diagnosis present

## 2010-06-08 DIAGNOSIS — Z794 Long term (current) use of insulin: Secondary | ICD-10-CM

## 2010-06-08 DIAGNOSIS — W010XXA Fall on same level from slipping, tripping and stumbling without subsequent striking against object, initial encounter: Secondary | ICD-10-CM | POA: Diagnosis present

## 2010-06-08 DIAGNOSIS — Z87311 Personal history of (healed) other pathological fracture: Secondary | ICD-10-CM

## 2010-06-08 DIAGNOSIS — I2510 Atherosclerotic heart disease of native coronary artery without angina pectoris: Secondary | ICD-10-CM | POA: Diagnosis present

## 2010-06-08 DIAGNOSIS — N183 Chronic kidney disease, stage 3 unspecified: Secondary | ICD-10-CM | POA: Diagnosis present

## 2010-06-08 LAB — BASIC METABOLIC PANEL
CO2: 28 mEq/L (ref 19–32)
Calcium: 10.1 mg/dL (ref 8.4–10.5)
Chloride: 105 mEq/L (ref 96–112)
Glucose, Bld: 140 mg/dL — ABNORMAL HIGH (ref 70–99)
Potassium: 5.5 mEq/L — ABNORMAL HIGH (ref 3.5–5.1)
Sodium: 142 mEq/L (ref 135–145)

## 2010-06-08 LAB — CBC
MCH: 31.6 pg (ref 26.0–34.0)
MCV: 94.2 fL (ref 78.0–100.0)
Platelets: 127 10*3/uL — ABNORMAL LOW (ref 150–400)
RBC: 3.29 MIL/uL — ABNORMAL LOW (ref 4.22–5.81)

## 2010-06-08 LAB — DIFFERENTIAL
Eosinophils Absolute: 0.2 10*3/uL (ref 0.0–0.7)
Lymphs Abs: 1.4 10*3/uL (ref 0.7–4.0)
Monocytes Relative: 6 % (ref 3–12)
Neutrophils Relative %: 83 % — ABNORMAL HIGH (ref 43–77)

## 2010-06-09 DIAGNOSIS — M7989 Other specified soft tissue disorders: Secondary | ICD-10-CM

## 2010-06-09 LAB — CBC
HCT: 27.9 % — ABNORMAL LOW (ref 39.0–52.0)
MCHC: 33.7 g/dL (ref 30.0–36.0)
MCV: 94.3 fL (ref 78.0–100.0)
RDW: 13.8 % (ref 11.5–15.5)

## 2010-06-09 LAB — URINALYSIS, MICROSCOPIC ONLY
Bilirubin Urine: NEGATIVE
Glucose, UA: NEGATIVE mg/dL
Hgb urine dipstick: NEGATIVE
Specific Gravity, Urine: 1.018 (ref 1.005–1.030)
Urobilinogen, UA: 0.2 mg/dL (ref 0.0–1.0)
pH: 5.5 (ref 5.0–8.0)

## 2010-06-09 LAB — COMPREHENSIVE METABOLIC PANEL
ALT: 20 U/L (ref 0–53)
Albumin: 3.2 g/dL — ABNORMAL LOW (ref 3.5–5.2)
Alkaline Phosphatase: 51 U/L (ref 39–117)
BUN: 83 mg/dL — ABNORMAL HIGH (ref 6–23)
Chloride: 104 mEq/L (ref 96–112)
Glucose, Bld: 183 mg/dL — ABNORMAL HIGH (ref 70–99)
Potassium: 4.7 mEq/L (ref 3.5–5.1)
Sodium: 139 mEq/L (ref 135–145)
Total Bilirubin: 0.9 mg/dL (ref 0.3–1.2)

## 2010-06-09 LAB — GLUCOSE, CAPILLARY
Glucose-Capillary: 225 mg/dL — ABNORMAL HIGH (ref 70–99)
Glucose-Capillary: 268 mg/dL — ABNORMAL HIGH (ref 70–99)

## 2010-06-09 LAB — DIFFERENTIAL
Eosinophils Relative: 2 % (ref 0–5)
Lymphocytes Relative: 12 % (ref 12–46)
Lymphs Abs: 1.2 10*3/uL (ref 0.7–4.0)
Monocytes Absolute: 0.7 10*3/uL (ref 0.1–1.0)
Monocytes Relative: 7 % (ref 3–12)

## 2010-06-09 LAB — MAGNESIUM: Magnesium: 2 mg/dL (ref 1.5–2.5)

## 2010-06-09 LAB — PHOSPHORUS: Phosphorus: 3.7 mg/dL (ref 2.3–4.6)

## 2010-06-09 LAB — TSH: TSH: 0.933 u[IU]/mL (ref 0.350–4.500)

## 2010-06-10 LAB — URINE CULTURE
Colony Count: 35000
Culture  Setup Time: 201204141720
Special Requests: NEGATIVE

## 2010-06-10 LAB — GLUCOSE, CAPILLARY: Glucose-Capillary: 204 mg/dL — ABNORMAL HIGH (ref 70–99)

## 2010-06-11 LAB — GLUCOSE, CAPILLARY
Glucose-Capillary: 253 mg/dL — ABNORMAL HIGH (ref 70–99)
Glucose-Capillary: 80 mg/dL (ref 70–99)

## 2010-06-11 LAB — BASIC METABOLIC PANEL
BUN: 76 mg/dL — ABNORMAL HIGH (ref 6–23)
Creatinine, Ser: 2.82 mg/dL — ABNORMAL HIGH (ref 0.4–1.5)
GFR calc non Af Amer: 22 mL/min — ABNORMAL LOW (ref >60)
Glucose, Bld: 212 mg/dL — ABNORMAL HIGH (ref 70–99)
Potassium: 4.4 mEq/L (ref 3.5–5.1)

## 2010-06-11 LAB — CBC
HCT: 27 % — ABNORMAL LOW (ref 39.0–52.0)
MCH: 31 pg (ref 26.0–34.0)
MCV: 95.1 fL (ref 78.0–100.0)
Platelets: 99 10*3/uL — ABNORMAL LOW (ref 150–400)
RDW: 13.6 % (ref 11.5–15.5)
WBC: 6.9 10*3/uL (ref 4.0–10.5)

## 2010-06-12 LAB — GLUCOSE, CAPILLARY: Glucose-Capillary: 112 mg/dL — ABNORMAL HIGH (ref 70–99)

## 2010-06-12 NOTE — Discharge Summary (Signed)
  [name], [name]             ACCOUNT NO.:  [id number]  MEDICAL RECORD NO.:  [id number]           PATIENT TYPE:  I  LOCATION:  1602                         FACILITY:  [facility]  PHYSICIAN:  Conley Canal, MD      DATE OF BIRTH:  12-12-1934  DATE OF ADMISSION:  06/08/2010 DATE OF DISCHARGE:  06/12/2010                              DISCHARGE SUMMARY   PRIMARY CARE PHYSICIAN:  Sigmund Hazel, MD  CARDIOLOGIST:  Corky Crafts, MD  UROLOGIST:  Courtney Paris, MD  NEPHROLOGIST:  Mindi Slicker. Lowell Guitar, MD  ENDOCRINOLOGIST:  Dr. Everardo All.  CONSULTING PHYSICIANS:  Dr. Erasmo Leventhal, MD, Orthopedics.  DISCHARGE DIAGNOSES: 1. Right hip nondisplaced acetabular fracture with a history of     previous proximal femur fracture with DHS plate fixation. 2. History of bladder cancer. 3. Chronic kidney disease, stage III. 4. Diabetes mellitus type 2, not optimally controlled. 5. Gout. 6. Hyperlipidemia. 7. Hypertension. 8. Peripheral neuropathy. 9. History of testicular cancer in childhood.  DISCHARGE MEDICATIONS: 1. Neurontin 100 mg twice daily. 2. Humulin NPH 6 units subcu at bedtime. 3. Allopurinol 100 mg daily. 4. Amlodipine 10 mg daily. 5. Aspirin 81 mg daily. 6. Calcitriol 0.25 mg daily. 7. Lasix 40 mg every other day. 8. Insulin lispro 14 units in the a.m., 5 at lunch, and 17 at supper     subcutaneously. 9. Toprol-XL 100 mg daily. 10.Simvastatin 20 mg daily. 11.Vitamin B12 500 mg daily.  PROCEDURES PERFORMED:  CT of the pelvis without contrast on June 08, 2010, showed stable appearing nondisplaced fractures through the anterior and middle zones of the right acetabulum.  HOSPITAL COURSE:  [name] is a pleasant 76 year old male with multiple comorbidities as noted above who came to the hospital with complaints of pain after he stumbled and fell and was found to have nondisplaced right acetabular fracture as described above.  The patient was seen by Dr.  Eugenia Mcalpine, Orthopedics who recommended conservative nonsurgical treatment.  The patient needs to be nonweightbearing on the right lower extremity for 6 weeks.  He needs to work with physical therapy for bed to chair or bed to wheelchair transfers.  He was referred to skilled nursing facility to continue physical therapy. Otherwise, he has been doing relatively okay with minimal pain.  He would need optimization of his diabetes mellitus.  Otherwise today labs include WBC 6.9, hemoglobin 8.8, hematocrit 27, platelet count 99. Sodium 141, potassium 4.4, BUN 76, creatinine 2.82, calcium 9, TSH 0.933.  He is discharged in stable condition.     Conley Canal, MD     SR/MEDQ  D:  06/12/2010  T:  06/12/2010  Job:  [id number]  cc:   Erasmo Leventhal, M.D. Fax: [phone]  Sigmund Hazel, M.D. Fax: [phone]  Corky Crafts, MD Fax: [phone]  Courtney Paris, M.D. Fax: [phone]  Mindi Slicker. Lowell Guitar, M.D. Fax: [phone]  Dr. Everardo All  Electronically Signed by Conley Canal  on 06/12/2010 09:18:14 PM

## 2010-06-13 LAB — GLUCOSE, CAPILLARY: Glucose-Capillary: 151 mg/dL — ABNORMAL HIGH (ref 70–99)

## 2010-06-13 NOTE — Discharge Summary (Signed)
  NAMEVUE, PAVON             ACCOUNT NO.:  [id number]  MEDICAL RECORD NO.:  [id number]           PATIENT TYPE:  I  LOCATION:  1602                         FACILITY:  [facility]  PHYSICIAN:  Andreas Blower, MD       DATE OF BIRTH:  01-Apr-1934  DATE OF ADMISSION:  06/08/2010 DATE OF DISCHARGE:  06/13/2010                        DISCHARGE SUMMARY - REFERRING   ADDENDUM:  Please refer to discharge summary dictated by Dr. Venetia Constable on June 12, 2010, for more details.  PRIMARY CARE PHYSICIAN:  Sigmund Hazel, MD.  CARDIOLOGIST:  April Holding, MD.  UROLOGIST:  Vic Blackbird, MD.  NEPHROLOGIST:  Mindi Slicker. Lowell Guitar, MD.  ENDOCRINOLOGIST:  Cleophas Dunker. Everardo All, MD.  ORTHOPEDIC PHYSICIAN:  Laymond Purser, MD.  DISCHARGE DIAGNOSES: 1. Right hip nondisplaced acetabular fracture with a history of     previous proximal femur fracture with VHS plate fixation. 2. Constipation. 3. History of bladder cancer. 4. Chronic kidney disease stage 3. 5. Anemia, likely due to chronic kidney disease stage 3. 6. Type 2 diabetes, uncontrolled. 7. Gout. 8. Hyperlipidemia. 9. Hypertension. 10.Peripheral neuropathy. 11.History of testicular cancer in childhood.  DISCHARGE MEDICATIONS: 1. Docusate 100 mg p.o. twice daily as needed. 2. Gabapentin 100 mg p.o. twice daily. 3. MiraLAX 17 g daily as needed. 4. Senna 1 tablet p.o. twice daily as needed. 5. Humulin N 6 subcu daily at bedtime. 6. Allopurinol 100 mg p.o. q.a.m. 7. Amlodipine 10 mg p.o. q.a.m. 8. Aspirin 81 mg p.o. q.a.m. 9. Calcitriol 0.25 mg p.o. q.a.m. 10.Furosemide 20 mg every other day. 11.Insulin lispro 14 units in the a.m., 5 units at lunch, and 17 units     at supper, three times a day with meals. 12.Metoprolol extended release 100 mg p.o. q.a.m. 13.Simvastatin 20 mg p.o. q.a.m. 14.Vitamin B12 500 mcg p.o. q.a.m.  HOSPITAL COURSE:  Please refer to the discharge summary dictated by Dr. Venetia Constable on June 12, 2010 for more details.  Since  June 12, 2010, the patient reported that he has been feeling constipated.  As a result, stool softeners were added to his regimen as needed.  Would hold these medications, if the patient is having diarrhea.  Otherwise, no changes to discharge summary since June 12, 2010.  Time spent on discharge, talking to the patient, coordinating care was 25 minutes.   Andreas Blower, MD   SR/MEDQ  D:  06/13/2010  T:  06/13/2010  Job:  [id number]  Electronically Signed by Wardell Heath Saide Lanuza  on 06/13/2010 07:55:17 PM

## 2010-06-21 NOTE — H&P (Signed)
NAMESHAKUR, LEMBO             ACCOUNT NO.:  [id number]  MEDICAL RECORD NO.:  [id number]           PATIENT TYPE:  I  LOCATION:  1602                         [facility]ACILITY:  [facility]  PHYSICIAN:  [name], M.D.DATE OF BIRTH:  1935/01/21  DATE OF ADMISSION:  06/08/2010 DATE OF DISCHARGE:                             HISTORY & PHYSICAL   HISTORY:  [facility] medical service requested an orthopedic consult for evaluation and treatment of [name]'s right hip acetabular fracture.  The patient is a 76 year old gentleman who does have a history of bladder cancer, coronary artery disease, diabetes, gout, hypercholesterolemia, hypertension, peripheral neuropathies, history of a right hip proximal fracture in the past requiring a DHS ORIF, and history of testicular cancer as a young man.  The patient was at home yesterday evening, about 2 in the afternoon he was walking in his house, stumbled and fell landing on his right hip.  He was having some pain in that right hip, was able to go up and down stairs, so he called EMS assistance, brought him to the emergency room for evaluation. Emergency room evaluation revealed he had a nondisplaced acetabular fracture on CT scan.  He had previous proximal femur probable nondisplaced intertrochanteric fracture which required DHS fixation while he was in [place] and plate was in good position without any signs of complicating factors.  The patient was admitted due to his medical history and pain for further evaluation on medical services.  PRIMARY CARE PHYSICIAN:  [name], M.D.  CARDIOLOGIST:  Dylan Crafts, MD.  UROLOGISTCourtney Daniels, M.D.  RENAL DOCTOR:  Dr. Ann Daniels.  DIABETES DOCTOR:  Dr. Everardo Daniels.  PHYSICAL EXAMINATION:  The patient is a healthy-appearing well-developed gentleman, conscious, alert, appropriate, sitting up in his hospital bed in no distress.  He has got good range of motion of cervical  spine.  He has got good range of motion of his upper extremities without any discomfort, deformities, or pain of his shoulders, elbows, wrist.  His left lower extremity was normal appearing without any swelling, erythema.  No palpable deformities.  He had excellent range of motion of his hip, knee and ankle.  His right hip was sore with any flexion- extension and internal-external rotation.  He had no palpable deformities of his femur, knee, or lower leg.  He had good motion of his knee and ankle.  He had good pulses in the lower extremities, good basic sensation.  His calves and thighs were soft and nontender.  RADIOLOGY:  Review of CAT scan and x-rays from the emergency room shows he has a nondisplaced right hip acetabular fracture with a DHS proximal femur side plate which is intact.  IMPRESSION:  Right hip nondisplaced acetabular fracture with a history of previous proximal femur fracture with DHS plate fixation.  PLAN:  At this time reviewing the patient's findings, Dr. Thomasena Daniels reviewed the x-rays and CT scan and has decided conservative nonsurgical treatment.  At this time, we are going to recommend that he be nonweightbearing on that right lower extremity for 6 weeks.  He may be worked with Physical Therapy for bed to chair  or bed to wheelchair transfers.  He may require assistance, he lives with his 34 year old wife and they live in a two-storey home.  We will continue to follow him while he is in the hospital..  This plan was discussed with the patient and he is in agreement with the current plan.     [name], P.A.   ______________________________ [name], M.D.    RWK/MEDQ  D:  06/09/2010  T:  06/09/2010  Job:  [id number]  cc:   [name], M.D. Fax: [phone]  Electronically Signed by [name] P.A. on 06/13/2010 12:52:22 PM Electronically Signed by [name] M.D. on 06/21/2010 09:29:36 AM

## 2010-06-23 NOTE — H&P (Signed)
[name], [name] NO.:  [id number]  MEDICAL RECORD NO.:  [id number]           PATIENT TYPE:  E  LOCATION:  WLED                         [facility]ACILITY:  [facility]  PHYSICIAN:  Michiel Cowboy, MDDATE OF BIRTH:  [birth year]  DATE OF ADMISSION:  06/08/2010 DATE OF DISCHARGE:                             HISTORY & PHYSICAL   PRIMARY CARE PROVIDER:  Sigmund Hazel, MD  CARDIOLOGIST:  Corky Crafts, MD  UROLOGIST:  Courtney Paris, MD  RENAL DOCTOR:  Dr.  Lowell Guitar.  DIABETES DOCTOR:  Dr. Everardo All.  HISTORY:  The patient is a 76 year old gentleman with past history of bladder cancer, diabetes, and coronary artery disease who has history of also right hip fracture in the past, he had stumbled and fallen resulting in severe pain to his right hip.  He was still able to be somewhat weightbearing, but in severe discomfort.  He presented to his primary care provider who sent him into the emergency department.  In the ED, he had imaging done.  The CT scan confirmed right acetabular fracture which was nondisplaced likely causing his pain.  At this point, [facility] was called for an admission.  ED did spoke with Orthopedics who felt this is most likely nonoperational case, they will see him in the morning.  REVIEW OF SYSTEMS:  Otherwise, no chest pains, no shortness of breath. Otherwise, he had been doing fairly besides the above-mentioned incident.  He denies any fevers or chills.  PAST MEDICAL HISTORY: 1. History of bladder cancer. 2. Coronary artery disease. 3. Diabetes. 4. Gout. 5. Hypercholesterolemia. 6. Hypertension. 7. Peripheral neuropathy. 8. Right hip fracture in the past. 9. History of testicular cancer as a young man. 10.Tremor of unclear etiology.  SOCIAL HISTORY:  The patient does not smoke or drink or abuse drugs.  He quit smoking 40 years ago.  FAMILY HISTORY:  Noncontributory.  ALLERGIES:  None.  MEDICATIONS: 1.  Simvastatin, unknown dose. 2. Humalog per sliding scale. 3. Allopurinol, the patient takes 100 mg daily. 4. Norvasc 10 mg daily. 5. Lasix 20 mg every other day. 6. Calcitriol, unknown dose. 7. Aspirin 81 mg daily. 8. Vitamin B12, he takes 500 mg daily.  PHYSICAL EXAMINATION:  VITAL SIGNS:  Temperature 98.5, blood pressure 163/58, pulse 72, respirations 20s, satting 97% on room air. GENERAL:  The patient appears to be currently in no acute distress, resting comfortably. HEENT:  Head nontraumatic.  Somewhat dry mucous membranes.  The patient is thirsty. LUNGS:  Clear to auscultation anteriorly. HEART: Regular rate and rhythm.  No murmurs appreciated. ABDOMEN:  Soft, nontender, nondistended. EXTREMITIES:  Lower extremities without clubbing or cyanosis.  His right lower extremity is somewhat more swollen than left, which he states is chronic.  He does usually get swelling more in the right than the left.  ASSESSMENT AND PLAN: 1. Acetabular fracture.  The patient is unable to be weightbearing.     We will admit for further management and pain management with     Physical Therapy and Occupational Therapy and possible either     placement or home with home health.  Orthopedics will see in  the     morning. 2. History of diabetes.  We will put him on sliding scale. 3. History of hypertension.  Continue Norvasc for now. 4. Right leg swelling.  Although the patient feels like it could be     chronic that he relates with injury, just to be on the safe side,     we will obtain Doppler, although his history is reassuring. 5. Prophylaxis.  Protonix and Lovenox since the patient is going to be     nonweightbearing, nonoperational. 6. Code status.  The patient wished to be do not resuscitate, do not     intubate which I actually confirmed with him twice and explained to     him what that means.     Michiel Cowboy, MD     AVD/MEDQ  D:  06/09/2010  T:  06/09/2010  Job:   [id number]  cc:   Sigmund Hazel, M.D. Fax: [phone]  Electronically Signed by Therisa Doyne MD on 06/23/2010 09:40:49 PM

## 2010-08-09 ENCOUNTER — Inpatient Hospital Stay (HOSPITAL_COMMUNITY)
Admission: EM | Admit: 2010-08-09 | Discharge: 2010-08-14 | DRG: 638 | Disposition: A | Discharge status: Skilled Nursing Facility | Payer: Medicare Other | Attending: Internal Medicine | Admitting: Internal Medicine

## 2010-08-09 ENCOUNTER — Emergency Department (HOSPITAL_COMMUNITY): Payer: Medicare Other

## 2010-08-09 DIAGNOSIS — N179 Acute kidney failure, unspecified: Secondary | ICD-10-CM | POA: Diagnosis present

## 2010-08-09 DIAGNOSIS — N183 Chronic kidney disease, stage 3 unspecified: Secondary | ICD-10-CM | POA: Diagnosis present

## 2010-08-09 DIAGNOSIS — J40 Bronchitis, not specified as acute or chronic: Secondary | ICD-10-CM | POA: Diagnosis present

## 2010-08-09 DIAGNOSIS — N039 Chronic nephritic syndrome with unspecified morphologic changes: Secondary | ICD-10-CM | POA: Diagnosis present

## 2010-08-09 DIAGNOSIS — D631 Anemia in chronic kidney disease: Secondary | ICD-10-CM | POA: Diagnosis present

## 2010-08-09 DIAGNOSIS — E785 Hyperlipidemia, unspecified: Secondary | ICD-10-CM | POA: Diagnosis present

## 2010-08-09 DIAGNOSIS — M109 Gout, unspecified: Secondary | ICD-10-CM | POA: Diagnosis present

## 2010-08-09 DIAGNOSIS — IMO0001 Reserved for inherently not codable concepts without codable children: Principal | ICD-10-CM | POA: Diagnosis present

## 2010-08-09 DIAGNOSIS — G609 Hereditary and idiopathic neuropathy, unspecified: Secondary | ICD-10-CM | POA: Diagnosis present

## 2010-08-09 DIAGNOSIS — I129 Hypertensive chronic kidney disease with stage 1 through stage 4 chronic kidney disease, or unspecified chronic kidney disease: Secondary | ICD-10-CM | POA: Diagnosis present

## 2010-08-09 DIAGNOSIS — Z4789 Encounter for other orthopedic aftercare: Secondary | ICD-10-CM

## 2010-08-09 LAB — DIFFERENTIAL
Basophils Absolute: 0 10*3/uL (ref 0.0–0.1)
Basophils Relative: 0 % (ref 0–1)
Eosinophils Absolute: 0.1 10*3/uL (ref 0.0–0.7)
Eosinophils Relative: 1 % (ref 0–5)
Neutrophils Relative %: 84 % — ABNORMAL HIGH (ref 43–77)

## 2010-08-09 LAB — COMPREHENSIVE METABOLIC PANEL
Alkaline Phosphatase: 95 U/L (ref 39–117)
Calcium: 9.7 mg/dL (ref 8.4–10.5)
GFR calc non Af Amer: 15 mL/min — ABNORMAL LOW (ref >60)
Glucose, Bld: 363 mg/dL — ABNORMAL HIGH (ref 70–99)
Sodium: 135 mEq/L (ref 135–145)
Total Bilirubin: 0.2 mg/dL — ABNORMAL LOW (ref 0.3–1.2)
Total Protein: 6.9 g/dL (ref 6.0–8.3)

## 2010-08-09 LAB — GLUCOSE, CAPILLARY
Glucose-Capillary: 394 mg/dL — ABNORMAL HIGH (ref 70–99)
Glucose-Capillary: 305 mg/dL — ABNORMAL HIGH (ref 70–99)

## 2010-08-09 LAB — URINALYSIS, ROUTINE W REFLEX MICROSCOPIC
Bilirubin Urine: NEGATIVE
Hgb urine dipstick: NEGATIVE
Ketones, ur: NEGATIVE mg/dL
Nitrite: NEGATIVE
Urobilinogen, UA: 0.2 mg/dL (ref 0.0–1.0)
pH: 5.5 (ref 5.0–8.0)

## 2010-08-09 LAB — CBC
Platelets: 159 10*3/uL (ref 150–400)
RDW: 13.5 % (ref 11.5–15.5)
WBC: 13.1 10*3/uL — ABNORMAL HIGH (ref 4.0–10.5)

## 2010-08-10 LAB — LIPID PANEL
Cholesterol: 103 mg/dL (ref 0–200)
HDL: 41 mg/dL (ref >39)
Total CHOL/HDL Ratio: 2.5 RATIO
Triglycerides: 57 mg/dL (ref <150)

## 2010-08-10 LAB — PROTIME-INR
INR: 1.32 (ref 0.00–1.49)
Prothrombin Time: 16.6 seconds — ABNORMAL HIGH (ref 11.6–15.2)

## 2010-08-10 LAB — IRON AND TIBC
Saturation Ratios: 6 % — ABNORMAL LOW (ref 20–55)
TIBC: 212 ug/dL — ABNORMAL LOW (ref 215–435)
UIBC: 200 ug/dL

## 2010-08-10 LAB — APTT: aPTT: 39 seconds — ABNORMAL HIGH (ref 24–37)

## 2010-08-10 LAB — CBC
HCT: 26.8 % — ABNORMAL LOW (ref 39.0–52.0)
Hemoglobin: 8.9 g/dL — ABNORMAL LOW (ref 13.0–17.0)
MCHC: 33.2 g/dL (ref 30.0–36.0)
RBC: 2.9 MIL/uL — ABNORMAL LOW (ref 4.22–5.81)

## 2010-08-10 LAB — GLUCOSE, CAPILLARY
Glucose-Capillary: 235 mg/dL — ABNORMAL HIGH (ref 70–99)
Glucose-Capillary: 99 mg/dL (ref 70–99)
Glucose-Capillary: 231 mg/dL — ABNORMAL HIGH (ref 70–99)

## 2010-08-10 LAB — BASIC METABOLIC PANEL
Chloride: 101 mEq/L (ref 96–112)
GFR calc Af Amer: 21 mL/min — ABNORMAL LOW (ref >60)
GFR calc non Af Amer: 17 mL/min — ABNORMAL LOW (ref >60)
Potassium: 3.7 mEq/L (ref 3.5–5.1)
Sodium: 139 mEq/L (ref 135–145)

## 2010-08-10 LAB — DIFFERENTIAL
Basophils Absolute: 0 10*3/uL (ref 0.0–0.1)
Basophils Relative: 0 % (ref 0–1)
Lymphocytes Relative: 7 % — ABNORMAL LOW (ref 12–46)
Monocytes Absolute: 0.6 10*3/uL (ref 0.1–1.0)
Neutro Abs: 8.8 10*3/uL — ABNORMAL HIGH (ref 1.7–7.7)
Neutrophils Relative %: 84 % — ABNORMAL HIGH (ref 43–77)

## 2010-08-10 LAB — FOLATE: Folate: >20 ng/mL

## 2010-08-10 LAB — VITAMIN B12: Vitamin B-12: 1576 pg/mL — ABNORMAL HIGH (ref 211–911)

## 2010-08-10 LAB — FERRITIN: Ferritin: 227 ng/mL (ref 22–322)

## 2010-08-11 LAB — GLUCOSE, CAPILLARY
Glucose-Capillary: 186 mg/dL — ABNORMAL HIGH (ref 70–99)
Glucose-Capillary: 130 mg/dL — ABNORMAL HIGH (ref 70–99)
Glucose-Capillary: 238 mg/dL — ABNORMAL HIGH (ref 70–99)
Glucose-Capillary: 254 mg/dL — ABNORMAL HIGH (ref 70–99)

## 2010-08-11 LAB — MAGNESIUM: Magnesium: 2 mg/dL (ref 1.5–2.5)

## 2010-08-11 LAB — BASIC METABOLIC PANEL
CO2: 25 mEq/L (ref 19–32)
Calcium: 9.6 mg/dL (ref 8.4–10.5)
Creatinine, Ser: 3.02 mg/dL — ABNORMAL HIGH (ref 0.50–1.35)
GFR calc non Af Amer: 20 mL/min — ABNORMAL LOW (ref >60)
Glucose, Bld: 122 mg/dL — ABNORMAL HIGH (ref 70–99)

## 2010-08-12 LAB — GLUCOSE, CAPILLARY: Glucose-Capillary: 260 mg/dL — ABNORMAL HIGH (ref 70–99)

## 2010-08-13 LAB — BASIC METABOLIC PANEL
GFR calc Af Amer: 26 mL/min — ABNORMAL LOW (ref >60)
GFR calc non Af Amer: 22 mL/min — ABNORMAL LOW (ref >60)
Glucose, Bld: 173 mg/dL — ABNORMAL HIGH (ref 70–99)
Potassium: 4.3 mEq/L (ref 3.5–5.1)
Sodium: 139 mEq/L (ref 135–145)

## 2010-08-13 LAB — GLUCOSE, CAPILLARY: Glucose-Capillary: 144 mg/dL — ABNORMAL HIGH (ref 70–99)

## 2010-08-14 LAB — GLUCOSE, CAPILLARY
Glucose-Capillary: 143 mg/dL — ABNORMAL HIGH (ref 70–99)
Glucose-Capillary: 156 mg/dL — ABNORMAL HIGH (ref 70–99)

## 2010-08-14 NOTE — Discharge Summary (Signed)
[name], [name] NO.:  [id number]  MEDICAL RECORD NO.:  [id number]  LOCATION:  1509                         FACILITY:  [facility]  PHYSICIAN:  Andreas Blower, MD       DATE OF BIRTH:  04-04-1934  DATE OF ADMISSION:  08/09/2010 DATE OF DISCHARGE:                        DISCHARGE SUMMARY - REFERRING   PRIMARY CARE PHYSICIAN:  Karlene Einstein, M.D., previously was Sigmund Hazel, M.D.  CARDIOLOGIST:  Corky Crafts, MD  UROLOGIST:  Courtney Paris, M.D.  DIABETIC DOCTOR:  Sean A. Everardo All, MD.  NEPHROLOGIST:  Mindi Slicker. Lowell Guitar, M.D.  DISCHARGE DIAGNOSES: 1. Uncontrolled type 2 diabetes. 2. Bronchitis. 3. Acute renal failure on chronic kidney disease stage III, resolved. 4. Anemia, likely due to chronic kidney disease. 5. Hypertension. 6. Anemia. 7. History of gout. 8. Hyperlipidemia. 9. Right hip nondisplaced acetabular fracture with history of previous     proximal femur fixture.  He was in rehab for a recent right hip     fracture in April 2012. 10.Hyperlipidemia. 11.Hypertension. 12.History of peripheral neuropathy. 13.History of testicular cancer in childhood.  DISCHARGE MEDICATIONS: 1. Albuterol 2.5 mg inhaled every 4 hours as needed for shortness of     breath. 2. Augmentin 500 mg p.o. twice daily, to be discontinued after August 14, 2010. 3. Calcium carbonate with vitamin D 200 international units 1 tablet     p.o. twice daily. 4. Furosemide 20 mg every other day. 5. Ferrous sulfate 325 mg p.o. twice daily with meals. 6. Guaifenesin DM 100/10 mg per 5 mL, 5 mL every 4 hours as needed for     cough. 7. NovoLog 2 units 3 times a day with meals. 8. Lantus 12 units subcu daily at bedtime. 9. Allopurinol 100 mg p.o. q.a.m. 10.Amlodipine 10 mg p.o. q.a.m. 11.Aspirin 81 mg p.o. q.a.m. 12.Calcitriol 0.25 mcg p.o. q.a.m. 13.Gabapentin 100 mg p.o. twice daily. 14.Hydralazine 25 mg p.o. 3 times a day. 15.Metoprolol XL 100 mg p.o.  q.a.m. 16.Simvastatin 20 mg p.o. q.p.m. 17.Vitamin B12 500 mcg p.o. q.a.m.  BRIEF ADMITTING HISTORY AND PHYSICAL:  [name] is a 76 year old Caucasian male from skilled nursing facility.  He was discharged on April 2012 for right hip nondisplaced acetabular fracture that was treated conservatively.  The patient presented with hyperglycemia with blood sugars persistently in 500s.  RADIOLOGY AND IMAGING:  The patient had portable chest x-ray which shows new blunting of costophrenic angles, particularly on the right suggest small effusion.  No other acute cardiopulmonary disease seen.  LABORATORY DATA:  CBC shows a white count of 10.4, hemoglobin 8.9, hematocrit 26.8, platelet count 160,000.  Electrolytes, sodium 139, potassium 4.3, chloride 106, CO2 23, BUN 83, creatinine 2.85.  HOSPITAL COURSE BY PROBLEM: 1. Uncontrolled diabetes.  The patient was started on Lantus and     sliding scale insulin.  His Lantus was adjusted to 12 units subcu     q.h.s. and will be on NovoLog 2 units subcu t.i.d. with meals.     Further titration of diabetic medications to be done as an     outpatient.  Would recommend having the patient follow with Dr.     Everardo All, an endocrinologist for  further management of his     diabetes. 2. Bronchitis.  The patient had a hoarse voice and was coughing.  As a     result, was started on empiric Augmentin.  He will be on Augmentin     for 1 more day up until August 14, 2010 to complete a 5-day course.     During the course hospital stay, the patient's voice and his     breathing has improved. 3. Acute renal failure on chronic kidney disease stage III.  The     patient was on torsemide.  Initially when the patient presented,     his creatinine was 3.86 which is significantly higher from his     baseline of 2.8.  The torsemide was held and the patient was gently     hydrated.  His creatinine at the discharge was 2.85.  The patient's     chest x-ray does show pleural  effusions.  Would recommend the     patient have an outpatient followup with Dr. Lowell Guitar, his     nephrologist, to determine the appropriate diuretics and for     consideration of possible dialysis. 4. Anemia, likely due to chronic kidney disease.  The patient was     started on supplemental iron. 5. Hypertension, stable.  Continue the patient on home medications. 6. History of gout, stable. 7. Right hip fracture.  The patient was evaluated by Physical Therapy.     The patient reported that Dr. Thomasena Edis had progressed weightbearing     to 50%.  As a result, the patient was weightbearing 50% with     physical therapy.  Will continue to need physical therapy after     discharge.  DISPOSITION AND FOLLOWUP:  The patient to follow up with Dr. Kerry Dory, his primary care physician, at the skilled nursing facility in 1 to 2 weeks.  Consider referral to Dr. Everardo All, his endocrinologist, in 1 to 2 weeks and schedule an appointment with Dr. Lowell Guitar, his nephrologist, in 1 to 2 weeks.  Time spent on discharge, talking to the patient and coordinating care was 40 minutes.     Andreas Blower, MD     SR/MEDQ  D:  08/13/2010  T:  08/13/2010  Job:  [id number]  Electronically Signed by Wardell Heath Enes Wegener  on 08/14/2010 08:42:51 PM

## 2010-08-23 ENCOUNTER — Ambulatory Visit: Payer: Medicare Other | Admitting: Endocrinology

## 2010-08-26 NOTE — H&P (Signed)
NAMEGRAYSON, [name]             ACCOUNT NO.:  [id number]  MEDICAL RECORD NO.:  [id number]  LOCATION:  1509                         FACILITY:  [facility]  PHYSICIAN:  Lonia Blood, M.D.      DATE OF BIRTH:  Jul 05, 1934  DATE OF ADMISSION:  08/09/2010 DATE OF DISCHARGE:                             HISTORY & PHYSICAL   CHIEF COMPLAINT:  Hyperglycemia.  HISTORY OF PRESENT ILLNESS:  This 76 year old male has been a SNF resident since being discharged from [facility], April 2012.  In April 2012, the patient suffered a right hip nondisplaced acetabular fracture that was treated conservatively without surgery.  He had been doing well at the skilled nursing facility receiving physical and occupational therapy in that setting.  He was noted to have several days of hyperglycemia in the 500 range.  He does state that he was on a diabetic diet and was receiving his regular home dosing of insulin.  He tells me that he has developed a non-congested cough over the last week and that some type of respiratory infection had been circulating within the skilled nursing facility.  He denies any indication of fever or purulent-appearing sputum.  He was seen in the [facility] Emergency Room and found to have blood sugars in the upper 300 range as well as a decline in his renal function.  At baseline, his creatinine is in the 2.5 to 2.8 range and is described as a stage 3 disease.  Currently, his creatinine is 3.86.  He is admitted to [facility] and will be followed by team 4.  PAST MEDICAL HISTORY: 1. Right hip nondisplaced acetabular fracture, April 2012, treated     conservatively without surgery. 2. Constipation. 3. History of bladder cancer. 4. Chronic kidney disease, stage 3. 5. Anemia, likely chronic disease. 6. Type 2 diabetes that had been controlled during hospitalization,     April 2012. 7. Gout. 8. Hyperlipidemia. 9. Hypertension. 10.Peripheral neuropathy. 11.History of  testicular cancer in childhood.  PRIMARY CARE PHYSICIAN:  Currently, Dr. Kerry Dory at the skilled nursing facility.  Previous primary care was Dr. Sigmund Hazel.  CARDIOLOGIST:  Corky Crafts, MD  UROLOGIST:  Courtney Paris, M.D.  DIABETIC DOCTOR:  Sean A. Everardo All, MD  CURRENT MEDICATIONS:  Per med reconciliation: 1. Humalog. 2. Lispro 5 to 17 units 3 times a day with meals. 3. Demadex 10 mg daily. 4. Simvastatin 20 mg daily p.m. 5. Metoprolol 100 mg daily a.m. 6. Hydralazine 25 mg 3 times daily. 7. Vitamin B12 500 mcg daily. 8. Humulin NPH insulin 6 units daily at bedtime. 9. Gabapentin 100 mg twice daily. 10.Calcitriol 0.25 mcg daily. 11.Aspirin 81 mg daily. 12.Amlodipine 10 mg daily. 13.Allopurinol 100 mg daily.  ALLERGIES:  Listed, no known drug allergies.  SOCIAL HISTORY:  The patient denies tobacco, alcohol or illicit drugs. He was a resident of skilled nursing facility prior to this hospitalization.  FAMILY HISTORY:  Positive propensity for diabetes, hypertension, coronary artery disease.  REVIEW OF SYSTEMS:  EYES:  Denies any visual change, discharge, pain. EARS:  Denies any change in hearing acuity, discharge, pain.  NOSE: Denies rhinitis or sinusitis.  MOUTH/THROAT:  States he has had a raspy voice  over the last week with his upper respiratory infection.  Denies actual sore throat.  LUNGS:  Some dyspnea with exertion.  He has had a dry cough and denies any purulent-appearing sputum.  ABDOMEN:  Denies nausea or vomiting.  He tends towards constipation.  Denies diarrhea. URINARY/GENITAL:  Denies dysuria or other symptoms suggestive of urinary tract infection.  MUSCULOSKELETAL:  Current history as above. NEUROLOGIC:  Denies stroke or seizure.  Denies any focal changes. HEMATOLOGIC:  Denies any abnormal bleeding or bruising.  PHYSICAL EXAM:  VITAL SIGNS:  Temperature 98.9, pulse 80, respirations 18, blood pressure 163/75, O2 sat is 96% on room  air. GENERAL APPEARANCE:  A well-developed male, in no distress.  He is alert, cooperative, pleasant.  The patient does have a cough which is nonproductive currently.  No distress and O2 sats are stable. HEENT:  Head normocephalic.  Eyes: Pupils equal.  Ears:  Canals clear and hearing normal to conversational tone.  Nose:  Nares patent without discharge noted.  Oral mucosa pink and moist. NECK:  No jugular venous distention, bruits, adenopathy, or thyromegaly. CARDIAC:  Rate and rhythm regular without murmur, S3, S4. LUNGS:  Few crackles in the right base otherwise clear without distress. ABDOMEN:  Soft with positive bowel sounds.  No pain, guarding, rebound, tenderness.  No bruits or masses. URINARY/GENITAL:  No bladder pain or CVA tenderness. MUSCULOSKELETAL:  Decreased range of motion, right hip and decreased strength, right leg.  Appears to be about 5/5 and equal in other extremities. EXTREMITIES:  No significant edema.  Peripheral pulses are palpable. Negative Homans. NEUROLOGIC:  Cranial nerves II through XII grossly intact.  The patient is alert and oriented.  No unilateral or focal defects.  HEMATOLOGIC: No abnormal bleeding or bruising.  LABORATORY DATA:  Two-view chest x-ray notes new blunting of the costophrenic angles particularly on the right.  This suggests small effusions.  No other acute cardiopulmonary disease.  Urinalysis unremarkable.  Comprehensive metabolic panel:  Sodium 135, potassium 4.2, chloride low 93, CO2 28, BUN 118, and creatinine 3.86, both are the patient's baseline.  Glucose elevated 363.  Liver function unremarkable, but total bilirubin low at 0.2.  Albumin low at 2.9.  CBC with diff found WBC elevated at 13.1, hemoglobin low 8.9, hematocrit low 27.2, platelets 159,000.  Neutrophil absolute high 11.0.  IMPRESSION/PLAN: 1. Hyperglycemia in a known type 2 diabetic, unclear etiology, but     this may be secondary to respiratory tract infection.  His  blood     sugars were elevated into the 500 range at the nursing home.  His     sugars are in the 300 range currently.  I will discontinue his NPH     insulin and start him on Lantus 10 units.  We will check his CBGs     every 4 hours initially and cover with moderate NovoLog sliding     scale.  When his blood sugars become more stable, we can back off     to a.c. and h.s..  Again, I suspect this may be secondary to upper     respiratory infection.  He states he was on a diabetic diet in the     nursing home.  I will continue him on a carb modified diet     currently. 2. Upper respiratory infection.  Chest x-ray did not suggest pneumonia     nor did the clinical exam.  I have placed him on nebulizers and     Mucinex.  I  have asked staff to send sputum for culture and     sensitivity.  I will cover him initially with Augmentin 500 mg q.12     h. with pharmacy to adjust dosing if needed and I will defer to     rounding physician the ultimate disposition of antibiotics. 3. Acute-on-chronic renal insufficiency.  Listed as stage 3 renal     disease.  His baseline creatinine appears to be about 2.5 to 2.8 in     range.  Currently, creatinine 3.8.  I suspect this is dehydration     secondary to his hyperglycemia.  I do note glucose in his urine.     We will hydrate with normal saline at 75 cc/hour and I placed him     on an oral diet as well.  We will recheck a metabolic panel in a.m. 4. Hypertension.  I will hold his diuretics secondary to his acute     renal failure.  We will continue metoprolol, hydralazine, and     amlodipine with holding parameters. 5. Left hip fracture.  Physical and occupational therapy as well as     social work. 6. Gout.  We will continue his allopurinol.  I see no indication of an     acute gout flare, thus I have deferred uric acid level. 7. Anemia.  We will check stool for occult blood and anemia panel, but     I suspect this is chronic disease due to his renal  function. 8. Hyperlipidemia.  We will check fasting lipids and continue his home     statin. 9. Deep venous  prophylaxis.  We will use Lovenox 40 mg subcu daily. 10.Code status.  The patient requests DNR status and appropriate     paperwork is submitted with admission orders.     Everett Graff, N.P.   ______________________________ Lonia Blood, M.D.    TC/MEDQ  D:  08/10/2010  T:  08/10/2010  Job:  [id number]  cc:   Karlene Einstein, M.D. Fax: [phone]  Corky Crafts, MD Fax: [phone]  Sigmund Hazel, M.D. Fax: [phone]  Courtney Paris, M.D. Fax: [phone]  Erasmo Leventhal, M.D. Fax: [phone]  Mindi Slicker. Lowell Guitar, M.D. Fax: [phone]  Sean A. Everardo All, MD [address] [place] [address]  Electronically Signed by Everett Graff N.P. on 08/10/2010 06:57:18 PM Electronically Signed by Lonia Blood M.D. on 08/26/2010 12:33:33 AM

## 2010-08-28 ENCOUNTER — Ambulatory Visit: Payer: Medicare Other | Admitting: Endocrinology

## 2010-08-30 ENCOUNTER — Encounter: Payer: Self-pay | Admitting: Endocrinology

## 2010-08-30 ENCOUNTER — Ambulatory Visit (INDEPENDENT_AMBULATORY_CARE_PROVIDER_SITE_OTHER): Payer: Medicare Other | Admitting: Endocrinology

## 2010-08-30 DIAGNOSIS — E1029 Type 1 diabetes mellitus with other diabetic kidney complication: Secondary | ICD-10-CM

## 2010-08-30 MED ORDER — INSULIN GLARGINE 100 UNIT/ML ~~LOC~~ SOLN: 10.0000 [IU] | Freq: Every day | SUBCUTANEOUS | Status: DC

## 2010-08-30 MED ORDER — INSULIN ASPART 100 UNIT/ML ~~LOC~~ SOLN: SUBCUTANEOUS | Status: DC

## 2010-08-30 NOTE — Progress Notes (Signed)
Subjective:    Patient ID: [name], male    DOB: September 01, 1934, 76 y.o.   MRN: [id number]  HPI Since last ov, pt has had fx hip (medical rx), and sob.  cbg's are mostly in the 100's.  He got out of the nursing facility 5 days ago.   This am, he accidentally cut the end of his right great toe.  Since then, he has a few hrs of slight intermittent bleeding there, but no assoc pain No past medical history on file.  Past Surgical History  Procedure Date  . Orchiectomy 1965    right    History   Social History  . Marital Status: Married    Spouse Name: N/A    Number of Children: N/A  . Years of Education: N/A   Occupational History  .      retired   Social History Main Topics  . Smoking status: Former Games developer  . Smokeless tobacco: Not on file  . Alcohol Use: No  . Drug Use: No  . Sexually Active: Not on file   Other Topics Concern  . Not on file   Social History Narrative  . No narrative on file    Current Outpatient Prescriptions on File Prior to Visit  Medication Sig Dispense Refill  . allopurinol (ZYLOPRIM) 100 MG tablet Take 100 mg by mouth daily.        . calcitRIOL (ROCALTROL) 0.25 MCG capsule Take 0.25 mcg by mouth daily.        . furosemide (LASIX) 40 MG tablet Take 40 mg by mouth daily.       . Insulin Pen Needle 31G X 8 MM MISC Inject 1 Device into the skin 4 (four) times daily.  100 each  11  . simvastatin (ZOCOR) 20 MG tablet Take 20 mg by mouth at bedtime.        Marland Kitchen DISCONTD: amLODipine (NORVASC) 10 MG tablet Take 10 mg by mouth daily.        Marland Kitchen DISCONTD: aspirin (ASPIRIN LOW DOSE) 81 MG tablet Take 81 mg by mouth daily.        Marland Kitchen DISCONTD: insulin lispro (HUMALOG KWIKPEN) 100 UNIT/ML injection 3x a day (just before each meal), 14-5-17 units  15 mL  11  . DISCONTD: insulin NPH (HUMULIN N PEN) 100 UNIT/ML injection Inject 2 Units into the skin at bedtime.  15 mL  12  . DISCONTD: metoprolol (LOPRESSOR) 100 MG tablet Take 100 mg by mouth daily.       Marland Kitchen  DISCONTD: vitamin B-12 (CYANOCOBALAMIN) 1000 MCG tablet Take 1,000 mcg by mouth daily.          No Known Allergies  Family History  Problem Relation Age of Onset  . Diabetes Father   . Diabetes Sister     BP 126/68  Pulse 69  Temp(Src) 98.5 F (36.9 C) (Oral)  Ht 6' (1.829 m)  Wt 153 lb 12.8 oz (69.763 kg)  BMI 20.86 kg/m2  SpO2 93%  Review of Systems denies hypoglycemia.  He attribites weight gain to fluid retention    Objective:   Physical Exam Pulses: dorsalis pedis intact bilat.   Feet: no deformity.  no ulcer on the feet.  feet are of normal color and temp.  2+ bilat leg edema.  There is a shallow laceration at the tip of the right great toe.   Neuro: sensation is intact to touch on the feet.  he brings a record of his cbg's which i have  reviewed today.  i also reviewed hospital records   Assessment & Plan:  Dm.  The pattern of his cbg's suggests he needs more novolog, and less lantus. Small laceration of the toe, new.

## 2010-08-30 NOTE — Patient Instructions (Addendum)
check your blood sugar 2 times a day.  vary the time of day when you check, between before the 3 meals, and at bedtime.  also check if you have symptoms of your blood sugar being too high or too low.  please keep a record of the readings and bring it to your next appointment here.  please call us sooner if you are having low blood sugar episodes, or if it stays high in the morning.  Reduce lantus to 10 units at bedtime. Increase novolog to 3x a day, (just before each meal) 3-2-3 units. Please make a follow-up appointment in 2-3 weeks.

## 2010-09-24 ENCOUNTER — Ambulatory Visit: Payer: Medicare Other | Admitting: Endocrinology

## 2010-10-01 ENCOUNTER — Ambulatory Visit: Payer: Medicare Other | Attending: Family Medicine | Admitting: Physical Therapy

## 2010-10-01 DIAGNOSIS — M6281 Muscle weakness (generalized): Secondary | ICD-10-CM | POA: Insufficient documentation

## 2010-10-01 DIAGNOSIS — IMO0001 Reserved for inherently not codable concepts without codable children: Secondary | ICD-10-CM | POA: Insufficient documentation

## 2010-10-01 DIAGNOSIS — R5381 Other malaise: Secondary | ICD-10-CM | POA: Insufficient documentation

## 2010-10-01 DIAGNOSIS — R262 Difficulty in walking, not elsewhere classified: Secondary | ICD-10-CM | POA: Insufficient documentation

## 2010-10-02 ENCOUNTER — Ambulatory Visit: Payer: Medicare Other | Admitting: Physical Therapy

## 2010-10-09 ENCOUNTER — Ambulatory Visit: Payer: Medicare Other | Admitting: Physical Therapy

## 2010-10-11 ENCOUNTER — Ambulatory Visit: Payer: Medicare Other | Admitting: Physical Therapy

## 2010-10-15 ENCOUNTER — Ambulatory Visit: Payer: Medicare Other | Admitting: Physical Therapy

## 2010-10-17 ENCOUNTER — Ambulatory Visit: Payer: Medicare Other | Admitting: Physical Therapy

## 2010-10-22 ENCOUNTER — Ambulatory Visit: Payer: Medicare Other | Admitting: Physical Therapy

## 2010-10-24 ENCOUNTER — Other Ambulatory Visit: Payer: Self-pay | Admitting: Endocrinology

## 2010-10-26 ENCOUNTER — Ambulatory Visit: Payer: Medicare Other | Admitting: Physical Therapy

## 2010-11-02 ENCOUNTER — Ambulatory Visit: Payer: Medicare Other | Attending: Family Medicine | Admitting: Physical Therapy

## 2010-11-02 DIAGNOSIS — IMO0001 Reserved for inherently not codable concepts without codable children: Secondary | ICD-10-CM | POA: Insufficient documentation

## 2010-11-02 DIAGNOSIS — M6281 Muscle weakness (generalized): Secondary | ICD-10-CM | POA: Insufficient documentation

## 2010-11-02 DIAGNOSIS — R5381 Other malaise: Secondary | ICD-10-CM | POA: Insufficient documentation

## 2010-11-02 DIAGNOSIS — R262 Difficulty in walking, not elsewhere classified: Secondary | ICD-10-CM | POA: Insufficient documentation

## 2010-11-06 ENCOUNTER — Ambulatory Visit: Payer: Medicare Other | Admitting: Physical Therapy

## 2010-11-09 ENCOUNTER — Ambulatory Visit: Payer: Medicare Other | Admitting: Physical Therapy

## 2010-11-13 ENCOUNTER — Encounter: Payer: Medicare Other | Admitting: Physical Therapy

## 2010-11-16 ENCOUNTER — Ambulatory Visit: Payer: Medicare Other | Admitting: Physical Therapy

## 2010-11-23 ENCOUNTER — Ambulatory Visit: Payer: Medicare Other | Admitting: Physical Therapy

## 2010-11-27 ENCOUNTER — Ambulatory Visit: Payer: Medicare Other | Attending: Family Medicine | Admitting: Physical Therapy

## 2010-11-27 DIAGNOSIS — IMO0001 Reserved for inherently not codable concepts without codable children: Secondary | ICD-10-CM | POA: Insufficient documentation

## 2010-11-27 DIAGNOSIS — R5381 Other malaise: Secondary | ICD-10-CM | POA: Insufficient documentation

## 2010-11-27 DIAGNOSIS — R262 Difficulty in walking, not elsewhere classified: Secondary | ICD-10-CM | POA: Insufficient documentation

## 2010-11-27 DIAGNOSIS — M6281 Muscle weakness (generalized): Secondary | ICD-10-CM | POA: Insufficient documentation

## 2010-11-28 ENCOUNTER — Ambulatory Visit: Payer: Medicare Other | Admitting: Physical Therapy

## 2010-12-04 ENCOUNTER — Ambulatory Visit: Payer: Medicare Other | Admitting: Physical Therapy

## 2010-12-06 ENCOUNTER — Ambulatory Visit: Payer: Medicare Other | Admitting: Physical Therapy

## 2010-12-11 ENCOUNTER — Ambulatory Visit: Payer: Medicare Other | Admitting: Physical Therapy

## 2010-12-13 ENCOUNTER — Ambulatory Visit: Payer: Medicare Other | Admitting: Physical Therapy

## 2010-12-18 ENCOUNTER — Ambulatory Visit: Payer: Medicare Other | Admitting: Physical Therapy

## 2010-12-20 ENCOUNTER — Ambulatory Visit: Payer: Medicare Other | Admitting: Physical Therapy

## 2010-12-24 ENCOUNTER — Ambulatory Visit: Payer: Medicare Other | Admitting: Physical Therapy

## 2010-12-27 ENCOUNTER — Ambulatory Visit: Payer: Medicare Other | Attending: Family Medicine | Admitting: Physical Therapy

## 2010-12-27 DIAGNOSIS — R262 Difficulty in walking, not elsewhere classified: Secondary | ICD-10-CM | POA: Insufficient documentation

## 2010-12-27 DIAGNOSIS — R5381 Other malaise: Secondary | ICD-10-CM | POA: Insufficient documentation

## 2010-12-27 DIAGNOSIS — IMO0001 Reserved for inherently not codable concepts without codable children: Secondary | ICD-10-CM | POA: Insufficient documentation

## 2010-12-27 DIAGNOSIS — M6281 Muscle weakness (generalized): Secondary | ICD-10-CM | POA: Insufficient documentation

## 2011-01-01 ENCOUNTER — Ambulatory Visit: Payer: Medicare Other | Admitting: Physical Therapy

## 2011-01-03 ENCOUNTER — Ambulatory Visit: Payer: Medicare Other | Admitting: Physical Therapy

## 2011-01-21 ENCOUNTER — Other Ambulatory Visit: Payer: Self-pay | Admitting: Urology

## 2011-02-07 ENCOUNTER — Encounter (HOSPITAL_COMMUNITY): Payer: Self-pay | Admitting: Pharmacy Technician

## 2011-02-13 ENCOUNTER — Encounter (HOSPITAL_COMMUNITY)
Admission: RE | Admit: 2011-02-13 | Discharge: 2011-02-13 | Disposition: A | Discharge status: Home / Self Care | Payer: Medicare Other | Source: Ambulatory Visit | Attending: Urology | Admitting: Urology

## 2011-02-13 ENCOUNTER — Encounter (HOSPITAL_COMMUNITY): Payer: Self-pay

## 2011-02-13 HISTORY — DX: Chronic kidney disease, unspecified: N18.9

## 2011-02-13 HISTORY — DX: Cardiac murmur, unspecified: R01.1

## 2011-02-13 HISTORY — DX: Essential (primary) hypertension: I10

## 2011-02-13 HISTORY — DX: Malignant neoplasm of bladder, unspecified: C67.9

## 2011-02-13 HISTORY — DX: Malignant neoplasm of unspecified testis, unspecified whether descended or undescended: C62.90

## 2011-02-13 LAB — CBC
HCT: 33.4 % — ABNORMAL LOW (ref 39.0–52.0)
MCV: 96 fL (ref 78.0–100.0)
RBC: 3.48 MIL/uL — ABNORMAL LOW (ref 4.22–5.81)
WBC: 10.2 10*3/uL (ref 4.0–10.5)

## 2011-02-13 LAB — BASIC METABOLIC PANEL
BUN: 83 mg/dL — ABNORMAL HIGH (ref 6–23)
CO2: 29 mEq/L (ref 19–32)
Chloride: 103 mEq/L (ref 96–112)
Creatinine, Ser: 2.42 mg/dL — ABNORMAL HIGH (ref 0.50–1.35)
Potassium: 4.9 mEq/L (ref 3.5–5.1)

## 2011-02-13 LAB — SURGICAL PCR SCREEN: MRSA, PCR: NEGATIVE

## 2011-02-13 NOTE — Patient Instructions (Addendum)
20 Henning Kluender  02/13/2011   Your procedure is scheduled on:  02/15/11  Report to [facility] at 9:30 AM.  Call this number if you have problems the morning of surgery: [phone]   Remember:   Do not eat food:After Midnight.  May have clear liquids:until Midnight .  Clear liquids include soda, tea, black coffee, apple or grape juice, broth.  Take these medicines the morning of surgery with A SIP OF WATER: ALLOPURINOL / METOPROLOL / SIMVASTATIN  /  DO NOT TAKE ANY INSULIN  THE MORNING OF SURGERY   Do not wear jewelry, make-up or nail polish.  Do not wear lotions, powders, or perfumes. You may wear deodorant.  Do not shave 48 hours prior to surgery.  Do not bring valuables to the hospital.  Contacts, dentures or bridgework may not be worn into surgery.  Leave suitcase in the car. After surgery it may be brought to your room.  For patients admitted to the hospital, checkout time is 11:00 AM the day of discharge.   Patients discharged the day of surgery will not be allowed to drive home.  Name and phone number of your driver:   Special Instructions: CHG Shower Use Special Wash: 1/2 bottle night before surgery and 1/2 bottle morning of surgery.   Please read over the following fact sheets that you were given: MRSA Information

## 2011-02-15 ENCOUNTER — Ambulatory Visit (HOSPITAL_COMMUNITY): Payer: Medicare Other | Admitting: Anesthesiology

## 2011-02-15 ENCOUNTER — Other Ambulatory Visit: Payer: Self-pay | Admitting: Urology

## 2011-02-15 ENCOUNTER — Encounter (HOSPITAL_COMMUNITY)
Admission: RE | Disposition: A | Discharge status: Home / Self Care | Payer: Self-pay | Source: Ambulatory Visit | Attending: Urology

## 2011-02-15 ENCOUNTER — Encounter (HOSPITAL_COMMUNITY): Payer: Self-pay | Admitting: *Deleted

## 2011-02-15 ENCOUNTER — Ambulatory Visit (HOSPITAL_COMMUNITY)
Admission: RE | Admit: 2011-02-15 | Discharge: 2011-02-16 | Disposition: A | Discharge status: Home / Self Care | Payer: Medicare Other | Source: Ambulatory Visit | Attending: Urology | Admitting: Urology

## 2011-02-15 ENCOUNTER — Encounter (HOSPITAL_COMMUNITY): Payer: Self-pay | Admitting: Anesthesiology

## 2011-02-15 ENCOUNTER — Ambulatory Visit (HOSPITAL_COMMUNITY): Payer: Medicare Other

## 2011-02-15 DIAGNOSIS — Z87891 Personal history of nicotine dependence: Secondary | ICD-10-CM | POA: Insufficient documentation

## 2011-02-15 DIAGNOSIS — Z8547 Personal history of malignant neoplasm of testis: Secondary | ICD-10-CM | POA: Insufficient documentation

## 2011-02-15 DIAGNOSIS — Z01812 Encounter for preprocedural laboratory examination: Secondary | ICD-10-CM | POA: Insufficient documentation

## 2011-02-15 DIAGNOSIS — Z79899 Other long term (current) drug therapy: Secondary | ICD-10-CM | POA: Insufficient documentation

## 2011-02-15 DIAGNOSIS — I10 Essential (primary) hypertension: Secondary | ICD-10-CM | POA: Insufficient documentation

## 2011-02-15 DIAGNOSIS — D090 Carcinoma in situ of bladder: Secondary | ICD-10-CM | POA: Insufficient documentation

## 2011-02-15 DIAGNOSIS — Z923 Personal history of irradiation: Secondary | ICD-10-CM | POA: Insufficient documentation

## 2011-02-15 DIAGNOSIS — M109 Gout, unspecified: Secondary | ICD-10-CM | POA: Insufficient documentation

## 2011-02-15 DIAGNOSIS — E119 Type 2 diabetes mellitus without complications: Secondary | ICD-10-CM | POA: Insufficient documentation

## 2011-02-15 HISTORY — PX: TRANSURETHRAL RESECTION OF BLADDER TUMOR: SHX2575

## 2011-02-15 HISTORY — PX: CYSTOSCOPY W/ RETROGRADES: SHX1426

## 2011-02-15 LAB — GLUCOSE, CAPILLARY
Glucose-Capillary: 116 mg/dL — ABNORMAL HIGH (ref 70–99)
Glucose-Capillary: 114 mg/dL — ABNORMAL HIGH (ref 70–99)

## 2011-02-15 SURGERY — CYSTOSCOPY, WITH RETROGRADE PYELOGRAM
Anesthesia: Spinal | Site: Bladder | Wound class: Clean Contaminated

## 2011-02-15 MED ORDER — SODIUM CHLORIDE 0.45 % IV SOLN
INTRAVENOUS | Status: DC
  Administered 2011-02-15 – 2011-02-16 (×2): via INTRAVENOUS

## 2011-02-15 MED ORDER — METOPROLOL SUCCINATE ER 100 MG PO TB24
100.0000 mg | ORAL_TABLET | Freq: Every day | ORAL | Status: DC
  Filled 2011-02-15: qty 1

## 2011-02-15 MED ORDER — FUROSEMIDE 40 MG PO TABS
40.0000 mg | ORAL_TABLET | Freq: Every day | ORAL | Status: DC
  Administered 2011-02-16: 40 mg via ORAL
  Filled 2011-02-15: qty 1

## 2011-02-15 MED ORDER — METOPROLOL SUCCINATE ER 100 MG PO TB24
100.0000 mg | ORAL_TABLET | Freq: Every day | ORAL | Status: DC
  Administered 2011-02-16: 100 mg via ORAL
  Filled 2011-02-15: qty 1

## 2011-02-15 MED ORDER — BELLADONNA ALKALOIDS-OPIUM 16.2-60 MG RE SUPP: 1.0000 | Freq: Four times a day (QID) | RECTAL | Status: DC | PRN

## 2011-02-15 MED ORDER — PROPOFOL 10 MG/ML IV EMUL
INTRAVENOUS | Status: DC | PRN
  Administered 2011-02-15: 60 ug/kg/min via INTRAVENOUS

## 2011-02-15 MED ORDER — SULFAMETHOXAZOLE-TRIMETHOPRIM 400-80 MG PO TABS: 1.0000 | ORAL_TABLET | Freq: Two times a day (BID) | ORAL | Status: AC

## 2011-02-15 MED ORDER — PROMETHAZINE HCL 25 MG/ML IJ SOLN: 6.2500 mg | INTRAMUSCULAR | Status: DC | PRN

## 2011-02-15 MED ORDER — HYDRALAZINE HCL 25 MG PO TABS
25.0000 mg | ORAL_TABLET | Freq: Three times a day (TID) | ORAL | Status: DC
  Administered 2011-02-15 – 2011-02-16 (×3): 25 mg via ORAL
  Filled 2011-02-15 (×5): qty 1

## 2011-02-15 MED ORDER — HYDROCODONE-ACETAMINOPHEN 5-325 MG PO TABS
1.0000 | ORAL_TABLET | ORAL | Status: AC | PRN
Start: 2011-02-15 — End: 2011-02-25

## 2011-02-15 MED ORDER — CEFAZOLIN SODIUM 1-5 GM-% IV SOLN: 1.0000 g | INTRAVENOUS | Status: DC

## 2011-02-15 MED ORDER — INSULIN GLARGINE 100 UNIT/ML ~~LOC~~ SOLN
10.0000 [IU] | Freq: Every day | SUBCUTANEOUS | Status: DC
  Administered 2011-02-16: 10 [IU] via SUBCUTANEOUS
  Filled 2011-02-15: qty 3

## 2011-02-15 MED ORDER — CEFAZOLIN SODIUM 1-5 GM-% IV SOLN
INTRAVENOUS | Status: DC | PRN
  Administered 2011-02-15: 1 g via INTRAVENOUS

## 2011-02-15 MED ORDER — MIDAZOLAM HCL 5 MG/5ML IJ SOLN
INTRAMUSCULAR | Status: DC | PRN
  Administered 2011-02-15: 2 mg via INTRAVENOUS

## 2011-02-15 MED ORDER — INSULIN ASPART 100 UNIT/ML ~~LOC~~ SOLN
0.0000 [IU] | Freq: Every day | SUBCUTANEOUS | Status: DC
  Administered 2011-02-15: 4 [IU] via SUBCUTANEOUS
  Filled 2011-02-15: qty 3

## 2011-02-15 MED ORDER — SIMVASTATIN 20 MG PO TABS
20.0000 mg | ORAL_TABLET | Freq: Every day | ORAL | Status: DC
  Administered 2011-02-15: 20 mg via ORAL
  Filled 2011-02-15 (×2): qty 1

## 2011-02-15 MED ORDER — BUPIVACAINE HCL 0.75 % IJ SOLN
INTRAMUSCULAR | Status: DC | PRN
  Administered 2011-02-15: 9 mg

## 2011-02-15 MED ORDER — STERILE WATER FOR IRRIGATION IR SOLN
Status: DC | PRN
  Administered 2011-02-15: 3000 mL

## 2011-02-15 MED ORDER — INSULIN ASPART 100 UNIT/ML ~~LOC~~ SOLN
3.0000 [IU] | Freq: Three times a day (TID) | SUBCUTANEOUS | Status: DC
  Administered 2011-02-15 – 2011-02-16 (×2): 3 [IU] via SUBCUTANEOUS
  Filled 2011-02-15: qty 3

## 2011-02-15 MED ORDER — ALLOPURINOL 100 MG PO TABS
100.0000 mg | ORAL_TABLET | Freq: Every day | ORAL | Status: DC
  Administered 2011-02-15 – 2011-02-16 (×2): 100 mg via ORAL
  Filled 2011-02-15 (×2): qty 1

## 2011-02-15 MED ORDER — HYDROCODONE-ACETAMINOPHEN 5-325 MG PO TABS: 1.0000 | ORAL_TABLET | ORAL | Status: DC | PRN

## 2011-02-15 MED ORDER — LACTATED RINGERS IV SOLN
INTRAVENOUS | Status: DC
  Administered 2011-02-15: 1000 mL via INTRAVENOUS

## 2011-02-15 MED ORDER — SODIUM CHLORIDE 0.9 % IR SOLN
Status: DC | PRN
  Administered 2011-02-15: 3000 mL

## 2011-02-15 MED ORDER — IOHEXOL 300 MG/ML  SOLN
INTRAMUSCULAR | Status: DC | PRN
  Administered 2011-02-15: 10 mL

## 2011-02-15 MED ORDER — INSULIN PEN NEEDLE 31G X 8 MM MISC: 1.0000 | Freq: Four times a day (QID) | Status: DC

## 2011-02-15 MED ORDER — HYDROMORPHONE HCL PF 1 MG/ML IJ SOLN: 0.2500 mg | INTRAMUSCULAR | Status: DC | PRN

## 2011-02-15 SURGICAL SUPPLY — 25 items
ADAPTER CATH URET PLST 4-6FR (CATHETERS) ×3 IMPLANT
BAG URINE DRAINAGE (UROLOGICAL SUPPLIES) ×3 IMPLANT
BAG URO CATCHER STRL LF (DRAPE) ×3 IMPLANT
CATH FOLEY 2WAY SLVR  5CC 18FR (CATHETERS) ×1
CATH FOLEY 2WAY SLVR 5CC 18FR (CATHETERS) ×2 IMPLANT
CATH INTERMIT  6FR 70CM (CATHETERS) ×3 IMPLANT
CATH URET 5FR 28IN CONE TIP (BALLOONS)
CATH URET 5FR 70CM CONE TIP (BALLOONS) IMPLANT
CLOTH BEACON ORANGE TIMEOUT ST (SAFETY) ×3 IMPLANT
DRAPE CAMERA CLOSED 9X96 (DRAPES) ×3 IMPLANT
ELECT REM PT RETURN 9FT ADLT (ELECTROSURGICAL) ×3
ELECTRODE REM PT RTRN 9FT ADLT (ELECTROSURGICAL) ×2 IMPLANT
EVACUATOR MICROVAS BLADDER (UROLOGICAL SUPPLIES) IMPLANT
GLOVE BIOGEL M 8.0 STRL (GLOVE) ×3 IMPLANT
GOWN PREVENTION PLUS XLARGE (GOWN DISPOSABLE) ×3 IMPLANT
GOWN STRL REIN XL XLG (GOWN DISPOSABLE) ×3 IMPLANT
GUIDEWIRE STR DUAL SENSOR (WIRE) ×3 IMPLANT
KIT ASPIRATION TUBING (SET/KITS/TRAYS/PACK) IMPLANT
LOOPS RESECTOSCOPE DISP (ELECTROSURGICAL) IMPLANT
MANIFOLD NEPTUNE II (INSTRUMENTS) ×3 IMPLANT
PACK CYSTO (CUSTOM PROCEDURE TRAY) ×3 IMPLANT
SYR CONTROL 10ML LL (SYRINGE) ×3 IMPLANT
SYRINGE IRR TOOMEY STRL 70CC (SYRINGE) ×3 IMPLANT
TUBING CONNECTING 10 (TUBING) ×3 IMPLANT
WIRE COONS/BENSON .038X145CM (WIRE) IMPLANT

## 2011-02-15 NOTE — Anesthesia Procedure Notes (Signed)
Spinal  Patient location during procedure: OR Start time: 02/15/2011 11:32 AM End time: 02/15/2011 11:42 AM Staffing Anesthesiologist: Lestine Box B Performed by: anesthesiologist  Preanesthetic Checklist Completed: patient identified, site marked, surgical consent, pre-op evaluation, timeout performed, IV checked, risks and benefits discussed and monitors and equipment checked Spinal Block Patient position: sitting Prep: Betadine Patient monitoring: heart rate, cardiac monitor, continuous pulse ox and blood pressure Approach: right paramedian Location: L3-4 Injection technique: single-shot Needle Needle type: Quincke  Needle gauge: 25 G Needle length: 9 cm Needle insertion depth: 3 cm Assessment Sensory level: T6 Additional Notes [id number], 2013-11

## 2011-02-15 NOTE — H&P (Signed)
Urology Admission H&P  Chief Complaint: bladder cancer  History of Present Illness:   This 76 year old male comes in today for repeat anesthetic cystoscopy and biopsy for followup of urothelial carcinoma of the bladder.   He originally presented to Dr. Aldean Ast on 02/01/2010. At that time he had 2 episodes of gross, painless hematuria. He had negative cultures prior to presentation. Evaluation included cystoscopy and CT abdomen and pelvis. CT revealed no evidence of renal disease, and no abnormalities within the bladder. Cystoscopy revealed a 1.5 cm papillary tumor at the base of the bladder, with a nearby 3 cm sessile tumor. He underwent TURBT on 02/27/2010. He tolerated the procedure well. Pathology revealed invasive high-grade urothelial carcinoma with some muscular involvement. The TURBT was performed on 04/24/2010. The bladder base revealed high-grade papillary urothelial carcinoma with adjacent carcinoma in situ, with no evidence of tumor involvement in the muscle. There was lamina propria invasion/involvement, however. The right trigone revealed invasive high-grade urothelial carcinoma with adjacent urothelial carcinoma in situ. There was slight muscular involvement.  He was seen by both Dr. Clelia Croft and Dr. Dayton Scrape. The patient was not felt to be a candidate for chemotherapy/radiotherapy. He did have abdominal/retroperitoneal radiation in the mid 60s, for testicular cancer. He underwent orchiectomy with radiation and retroperitoneal lymph node dissection. He is cancer free regarding testicular cancer.  He was last seen by Dr. Aldean Ast in April, 2012. At that time he was to have been scheduled for BCG. Unfortunately, he had an orthopedic injury, requiring surgery and rehabilitation. I first saw him on 10/04/2010 and strongly recommended intravesical BCG treatments. 6 induction treatments were completed 11/07/2010. Recent cystoscopyin my office revealed erythematous bladder neck, cytologies were positive.  He is here today for definitive biopsy.   Past Medical History  Diagnosis Date  . Hypertension   . Heart murmur   . Gout   . Chronic kidney disease     RENAL INSUFFICIENCY  . Diabetes mellitus   . Bladder cancer   . Testicular cancer     1965   Past Surgical History  Procedure Date  . Orchiectomy 1965    right  . Turbt   . Fx femur 2006    Home Medications:  Prescriptions prior to admission  Medication Sig Dispense Refill  . allopurinol (ZYLOPRIM) 100 MG tablet Take 100 mg by mouth daily.       . furosemide (LASIX) 40 MG tablet Take 40 mg by mouth every morning.       . hydrALAZINE (APRESOLINE) 25 MG tablet Take 25 mg by mouth 3 (three) times daily.       . insulin aspart (NOVOLOG) 100 UNIT/ML injection Inject 3 Units into the skin 3 (three) times daily before meals.        . insulin glargine (LANTUS) 100 UNIT/ML injection Inject 10 Units into the skin every morning.        . Insulin Pen Needle 31G X 8 MM MISC Inject 1 Device into the skin 4 (four) times daily.  100 each  11  . metoprolol (TOPROL-XL) 100 MG 24 hr tablet       . simvastatin (ZOCOR) 20 MG tablet Take 20 mg by mouth every morning.       . vitamin B-12 (CYANOCOBALAMIN) 500 MCG tablet Take 500 mcg by mouth daily.         Allergies: No Known Allergies  Family History  Problem Relation Age of Onset  . Diabetes Father   . Diabetes Sister    Social  History:  reports that he has quit smoking. He does not have any smokeless tobacco history on file. He reports that he does not drink alcohol or use illicit drugs.  Review of Systems  Constitutional: Negative.   HENT: Negative.   Eyes: Negative.   Respiratory: Negative.   Cardiovascular: Negative.   Genitourinary: Negative for hematuria.  Musculoskeletal: Negative.   Skin: Negative.   All other systems reviewed and are negative.    Physical Exam:  Vital signs in last 24 hours: Temp:  [98.4 F (36.9 C)] 98.4 F (36.9 C) (12/21 0924) Pulse Rate:  [67] 67   (12/21 0924) Resp:  [16] 16  (12/21 0924) BP: (148)/(80) 148/80 mmHg (12/21 0924) SpO2:  [100 %] 100 % (12/21 0924) Physical Exam  Constitutional: He appears well-developed and well-nourished.  HENT:  Head: Normocephalic and atraumatic.  Eyes: Conjunctivae are normal. Pupils are equal, round, and reactive to light.  Neck: Normal range of motion. Neck supple.  Cardiovascular: Normal rate.   Respiratory: Effort normal and breath sounds normal.  GI: Soft. Bowel sounds are normal.  Genitourinary: Penis normal.  Musculoskeletal: Normal range of motion.  Neurological: He is alert.  Skin: Skin is warm and dry.    Laboratory Data:  Results for orders placed during the hospital encounter of 02/15/11 (from the past 24 hour(s))  GLUCOSE, CAPILLARY     Status: Abnormal   Collection Time   02/15/11  9:39 AM      Component Value Range   Glucose-Capillary 116 (*) 70 - 99 (mg/dL)   Recent Results (from the past 240 hour(s))  SURGICAL PCR SCREEN     Status: Normal   Collection Time   02/13/11 10:01 AM      Component Value Range Status Comment   MRSA, PCR NEGATIVE  NEGATIVE  Final    Staphylococcus aureus NEGATIVE  NEGATIVE  Final    Creatinine:  Basename 02/13/11 1050  CREATININE 2.42*   Baseline Creatinine:   Impression/Assessment:  Urothelial carcinoma of the bladder. Status post resection earlier this year, followed by induction BCG. He has had recent positive cytologies.  Plan:  Cystoscopy under anesthesia, bladder biopsy, bilateral retrograde ureteropyelograms.  Marcine Matar M 02/15/2011, 11:07 AM

## 2011-02-15 NOTE — Op Note (Signed)
Preoperative diagnosis: History of urothelial carcinoma, status post resection and BCG induction. Recent positive cytology. Postoperative diagnosis: Same Procedure: Cystoscopy, bladder biopsy, bladder barbotage, left retrograde ureteral pyelogram, attempted right retrograde ureteropyelogram  Surgeon: Bertram Millard. Kacyn Souder, M.D.  Anesthesia: Subarachnoid block  Indications: Elderly male status post TURBT earlier in the year by Dr. Aldean Ast. A high-grade urothelial carcinoma. He is not felt to be a candidate for radiotherapy, as he has a history of prior retroperitoneal radiation for testicular cancer. He presents at this time following recent cystoscopy and positive cytology.     Technique and findings: After proper patient identification and IV antibiotics, he was taken to the operating room where subarachnoid block was administered. He is placed in the dorsolithotomy position. Genitalia and perineum were prepped and draped. Timeout was then performed. A 22 French ureteroscope was advanced under direct vision. His prostatic urethra was somewhat fixed, most likely due to prior radiotherapy. Prostate was nonobstructing. Bladder was entered and inspected circumferentially. There were slightly erythematous urothelium in the trigonal region, but no specific lesions were seen. There was some "cobblestone" urothelium towards the right of the bladder neck. I did not see any nodular lesions. The entire bladder was inspected and was otherwise unremarkable. I did not see the right ureteral orifice. The left was somewhat pinpoint. I feel the right ureteral orifice was most likely distorted due to prior resection.  Bladder barbotage was taken, and sent as bladder washings. 3 biopsies were taken in the trigonal region using the cold cup biopsy forceps and sent as "bladder biopsies". They did not bleed significantly, and I did not need to cauterize the biopsy sites. I then eventually navigated a 6 [place] open-ended  catheter into the left distal ureter using the assistance of the guidewire. Retrograde ureteropyelogram was performed. This showed a normal ureter without evidence of filling defects. The pyelocalyceal system was nondilated, and had no evidence of filling defects. There were some bubbles at the UPJ which moved with filling of the system. I tried multiple times, with both the 12 and 70 lens coming to see the right ureteral orifice. I was unable to find this. At this point no b bleeding was seen from the biopsy sites, and the scope was removed. An 3 French Foley catheter was placed. He was then taken to PACU in stable condition, having tolerated the procedure well.

## 2011-02-15 NOTE — Interval H&P Note (Signed)
History and Physical Interval Note:  02/15/2011 11:20 AM  [name]  has presented today for surgery, with the diagnosis of bladder cancer  The various methods of treatment have been discussed with the patient and family. After consideration of risks, benefits and other options for treatment, the patient has consented to  Procedure(s): CYSTOSCOPY WITH RETROGRADE PYELOGRAM TRANSURETHRAL RESECTION OF BLADDER TUMOR (TURBT) as a surgical intervention .  The patients' history has been reviewed, patient examined, no change in status, stable for surgery.  I have reviewed the patients' chart and labs.  Questions were answered to the patient's satisfaction.     Chelsea Aus

## 2011-02-15 NOTE — Transfer of Care (Signed)
Immediate Anesthesia Transfer of Care Note  Patient: [name]  Procedure(s) Performed:  CYSTOSCOPY WITH RETROGRADE PYELOGRAM - Cystoscopy, Transurethral Resection of Bladder Tumor, Left Retrograde Pyelograms  ; TRANSURETHRAL RESECTION OF BLADDER TUMOR (TURBT)  Patient Location: PACU  Anesthesia Type: Spinal  Level of Consciousness: awake  Airway & Oxygen Therapy: Patient Spontanous Breathing and Patient connected to face mask oxygen  Post-op Assessment: Report given to PACU RN and Post -op Vital signs reviewed and stable  Post vital signs: Reviewed and stable  Complications: No apparent anesthesia complications

## 2011-02-15 NOTE — Anesthesia Postprocedure Evaluation (Signed)
  Anesthesia Post-op Note  Patient: [name]  Procedure(s) Performed:  CYSTOSCOPY WITH RETROGRADE PYELOGRAM - Cystoscopy, Transurethral Resection of Bladder Tumor, Left Retrograde Pyelograms  ; TRANSURETHRAL RESECTION OF BLADDER TUMOR (TURBT)  Patient Location: PACU  Anesthesia Type: Spinal  Level of Consciousness: oriented and sedated  Airway and Oxygen Therapy: Patient Spontanous Breathing and Patient connected to nasal cannula oxygen  Post-op Pain: none  Post-op Assessment: Post-op Vital signs reviewed, Patient's Cardiovascular Status Stable, Respiratory Function Stable and Patent Airway  Post-op Vital Signs: stable  Complications: No apparent anesthesia complications

## 2011-02-15 NOTE — Anesthesia Preprocedure Evaluation (Signed)
Anesthesia Evaluation  Patient identified by MRN, date of birth, ID band Patient awake    Reviewed: Allergy & Precautions, H&P , NPO status , Patient's Chart, lab work & pertinent test results, reviewed documented beta blocker date and time   Airway Mallampati: II  Neck ROM: Full    Dental   Pulmonary neg pulmonary ROS,  clear to auscultation        Cardiovascular hypertension, Pt. 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    Musculoskeletal negative musculoskeletal ROS (+)   Abdominal   Peds negative pediatric ROS (+)  Hematology negative hematology ROS (+)   Anesthesia Other Findings   Reproductive/Obstetrics negative OB ROS                           Anesthesia Physical Anesthesia Plan  ASA: III  Anesthesia Plan: Spinal   Post-op Pain Management:    Induction:   Airway Management Planned: Mask  Additional Equipment:   Intra-op Plan:   Post-operative Plan:   Informed Consent: I have reviewed the patients History and Physical, chart, labs and discussed the procedure including the risks, benefits and alternatives for the proposed anesthesia with the patient or authorized representative who has indicated his/her understanding and acceptance.     Plan Discussed with: CRNA and Surgeon  Anesthesia Plan Comments:         Anesthesia Quick Evaluation

## 2011-02-16 LAB — GLUCOSE, CAPILLARY: Glucose-Capillary: 182 mg/dL — ABNORMAL HIGH (ref 70–99)

## 2011-02-16 NOTE — Progress Notes (Signed)
1 Day Post-Op Subjective: Patient without complaints. Voiding with good FOS.   Objective: Vital signs in last 24 hours: Temp:  [96.6 F (35.9 C)-99.5 F (37.5 C)] 99.5 F (37.5 C) (12/22 0444) Pulse Rate:  [56-75] 75  (12/22 0444) Resp:  [10-19] 17  (12/22 0444) BP: (121-156)/(54-104) 153/63 mmHg (12/22 0444) SpO2:  [95 %-100 %] 95 % (12/22 0444) Weight:  [66.254 kg (146 lb 1 oz)] 146 lb 1 oz (66.254 kg) (12/21 1639)  Intake/Output from previous day: 12/21 0701 - 12/22 0700 In: 2240 [P.O.:240; I.V.:2000] Out: 1626 [Urine:1625; Stool:1] Intake/Output this shift: Total I/O In: 360 [P.O.:360] Out: -   Physical Exam:  NAD, A and O x 3 Well appearing, ambulating around room. Voided 150 mL which was clear.    Lab Results: No results found for this basename: HGB:3,HCT:3 in the last 72 hours BMET No results found for this basename: NA:2,K:2,CL:2,CO2:2,GLUCOSE:2,BUN:2,CREATININE:2,CALCIUM:2 in the last 72 hours No results found for this basename: LABPT:3,INR:3 in the last 72 hours No results found for this basename: LABURIN:1 in the last 72 hours Results for orders placed during the hospital encounter of 02/13/11  SURGICAL PCR SCREEN     Status: Normal   Collection Time   02/13/11 10:01 AM      Component Value Range Status Comment   MRSA, PCR NEGATIVE  NEGATIVE  Final    Staphylococcus aureus NEGATIVE  NEGATIVE  Final       Assessment/Plan:  -stable post-op cysto bbx, left RGP  Home today   LOS: 1 day   Antony Haste 02/16/2011, 11:15 AM

## 2011-02-21 ENCOUNTER — Encounter (HOSPITAL_COMMUNITY): Payer: Self-pay | Admitting: Urology

## 2011-02-25 NOTE — Discharge Summary (Signed)
Patient ID: [name] MRN: [id number] DOB/AGE: [id number] y.o.  Admit date: 02/15/2011 Discharge date: 02/25/2011  Primary Care Physician:  Neldon Labella, MD, MD  Discharge Diagnoses:   Urothelial carcinoma of the bladder  Consults:  none none   Discharge Medications: Discharge Medication List as of 02/16/2011 11:57 AM    START taking these medications   Details  HYDROcodone-acetaminophen (NORCO) 5-325 MG per tablet Take 1-2 tablets by mouth every 4 (four) hours as needed., Starting 02/15/2011, Until Mon 02/25/11, Print    sulfamethoxazole-trimethoprim (BACTRIM) 400-80 MG per tablet Take 1 tablet by mouth 2 (two) times daily., Starting 02/15/2011, Until Mon 02/25/11, Print      CONTINUE these medications which have NOT CHANGED   Details  allopurinol (ZYLOPRIM) 100 MG tablet Take 100 mg by mouth daily. , Until Discontinued, Historical Med    furosemide (LASIX) 40 MG tablet Take 40 mg by mouth every morning. , Until Discontinued, Historical Med    hydrALAZINE (APRESOLINE) 25 MG tablet Take 25 mg by mouth 3 (three) times daily. , Until Discontinued, Historical Med    insulin aspart (NOVOLOG) 100 UNIT/ML injection Inject 3 Units into the skin 3 (three) times daily before meals.  , Starting 08/30/2010, Until Discontinued, Historical Med    insulin glargine (LANTUS) 100 UNIT/ML injection Inject 10 Units into the skin every morning.  , Starting 08/30/2010, Until Fri 08/30/11, Historical Med    Insulin Pen Needle 31G X 8 MM MISC Inject 1 Device into the skin 4 (four) times daily., Starting 05/30/2010, Until Discontinued, Normal    metoprolol (TOPROL-XL) 100 MG 24 hr tablet Starting 10/24/2010, Until Discontinued, Historical Med    simvastatin (ZOCOR) 20 MG tablet Take 20 mg by mouth every morning. , Until Discontinued, Historical Med    vitamin B-12 (CYANOCOBALAMIN) 500 MCG tablet Take 500 mcg by mouth daily.  , Until Discontinued, Historical Med         Significant  Diagnostic Studies:  No results found.  Brief H and P: For complete details please refer to admission H and P, but in brief the patient is admitted for anesthetic biopsy of the bladder to follow-up urothelial carcinoma of the bladder after initial resection of the bladder tumor by Dr. Aldean Ast, and BCG induction.  Hospital Course:  The patient was admitted for observation after anesthetic cystoscopy, retrograde pyelogram and bladder biopsy. He tolerated the procedure well. He remained stable after the procedure, and voided well after the catheter was removed on POD 1.  Day of Discharge BP 153/63  Pulse 75  Temp(Src) 99.5 F (37.5 C) (Oral)  Resp 17  Ht 6' (1.829 m)  Wt 66.254 kg (146 lb 1 oz)  BMI 19.81 kg/m2  SpO2 95%  No results found for this or any previous visit (from the past 24 hour(s)).  Physical Exam: General: Alert and awake oriented x3 not in any acute distress. HEENT: anicteric sclera, pupils reactive to light and accommodation CVS: S1-S2 clear no murmur rubs or gallops Chest: clear to auscultation bilaterally, no wheezing rales or rhonchi Abdomen: soft nontender, nondistended, normal bowel sounds, no organomegaly Extremities: no cyanosis, clubbing or edema noted bilaterally Neuro: Cranial nerves II-XII intact, no focal neurological deficits  Disposition: Home   Diet: No restrictions  Activity: No restrictions   Disposition and Follow-up:    TESTS THAT NEED FOLLOW-UP Bladder biopsy results  DISCHARGE FOLLOW-UP Follow-up Information    Follow up with Chelsea Aus, MD. (as scheduled)    Contact information:   [address]  [facility] [address] [facility] [place] [address] [phone]          Time spent on Discharge: 10 minutes  Signed: Chelsea Aus 02/25/2011, 9:27 AM

## 2011-06-28 ENCOUNTER — Telehealth: Payer: Self-pay | Admitting: *Deleted

## 2011-06-28 NOTE — Telephone Encounter (Signed)
[employer] called stating that pt needs PA for Novolog-phone # [phone] ID #[id number]

## 2011-07-01 NOTE — Telephone Encounter (Signed)
PA Approved 07/01/2011-06/30/2012-Pharmacist at [facility] informed.

## 2011-07-01 NOTE — Telephone Encounter (Signed)
Called [employer] reply from [employer].

## 2011-09-10 IMAGING — CT CT PELVIS W/O CM
1 of 2 series · 11 of 32 positions shown, 17 images · non-contrast
Comparison: Plain films 06/08/2010

CLINICAL DATA: The patient fell today.  Right hip pain.

CT PELVIS WITHOUT CONTRAST
TECHNIQUE: Multidetector CT imaging of the pelvis was performed
following the standard protocol without intravenous contrast.

[Series 6: pelvis_hip 3.0 b60f st · axial · 0.38mm/px · z∈[-456,-262]mm · 11 of 79 slices shown, 17 images]
[im 7/79  soft-tissue]
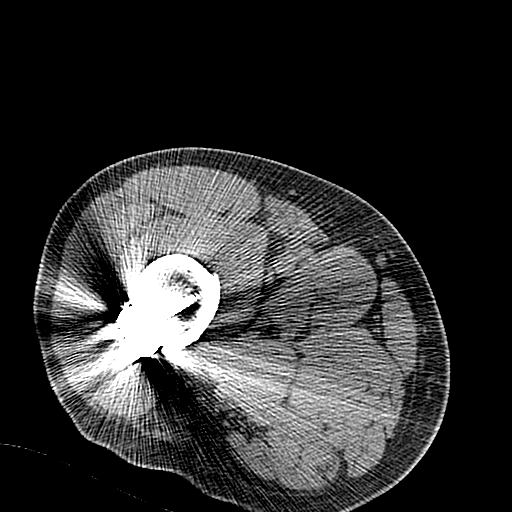
[im 7/79  bone]
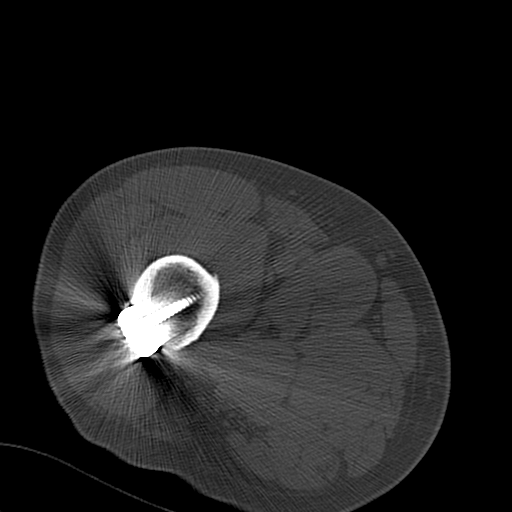
[im 14/79  soft-tissue]
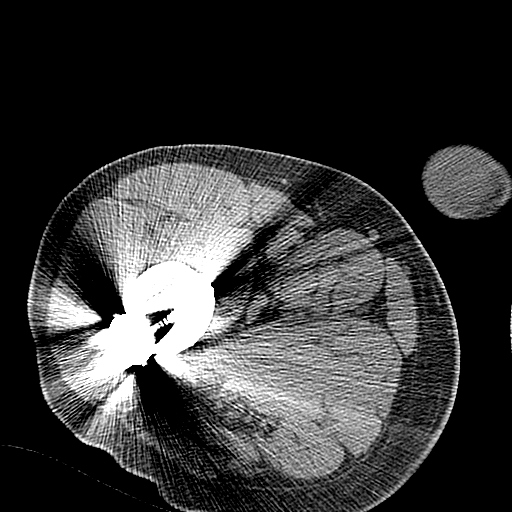
[im 20/79  soft-tissue]
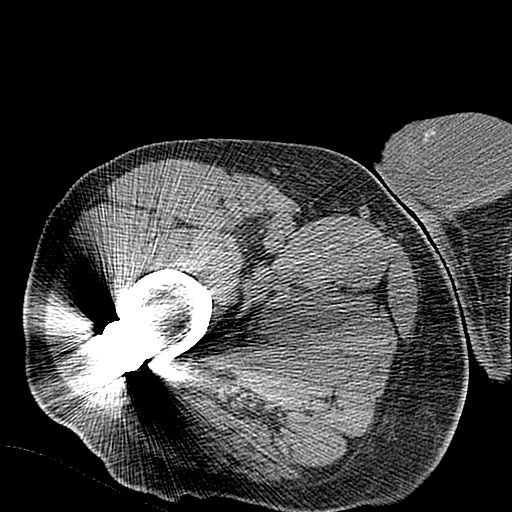
[im 27/79  soft-tissue]
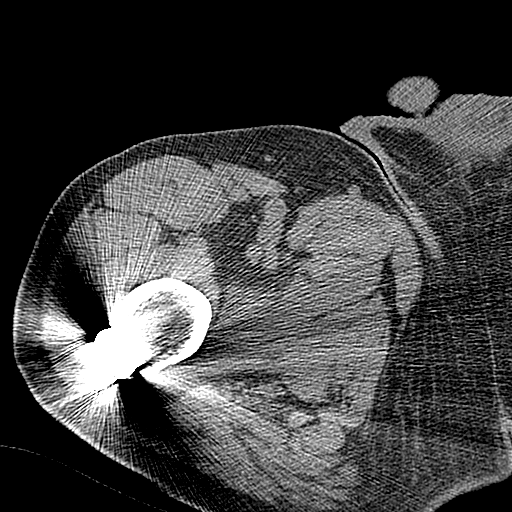
[im 33/79  soft-tissue]
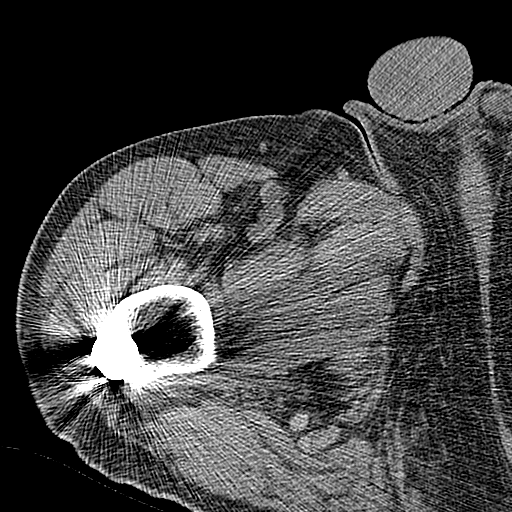
[im 40/79  soft-tissue]
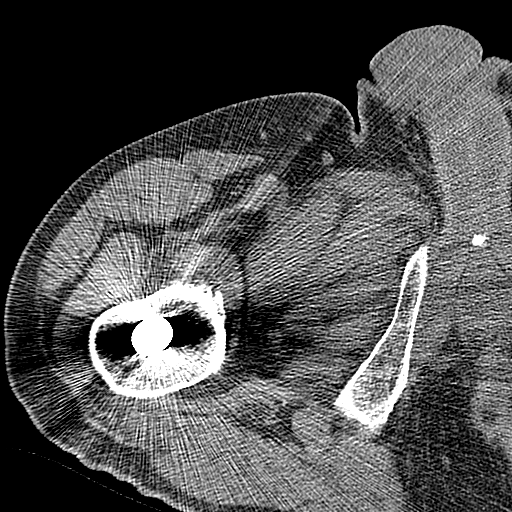
[im 46/79  soft-tissue]
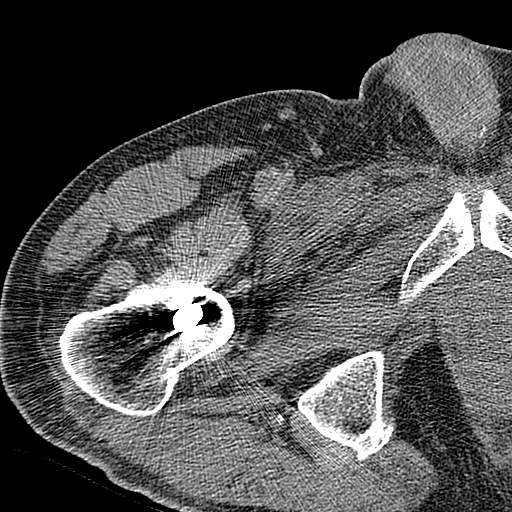
[im 53/79  soft-tissue]
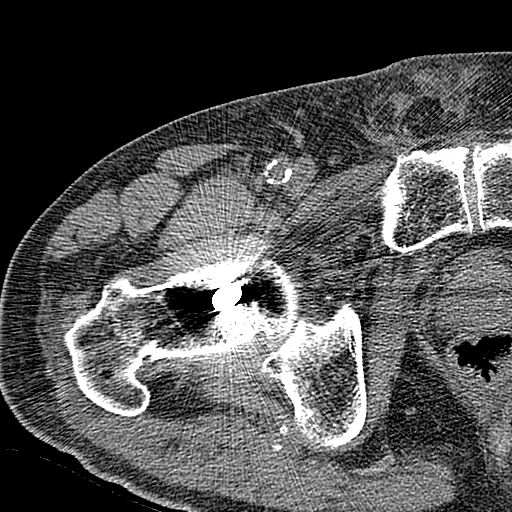
[im 53/79  lung]
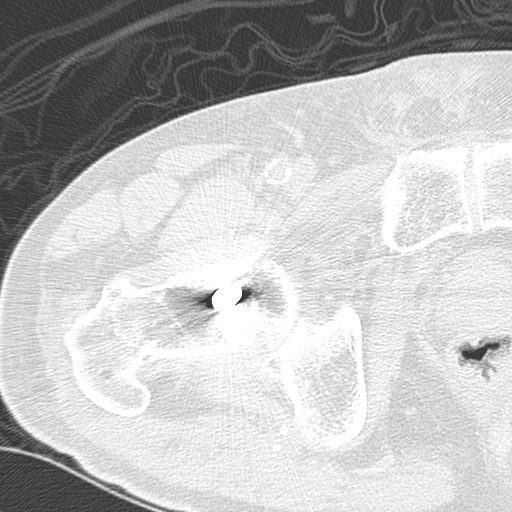
[im 59/79  soft-tissue]
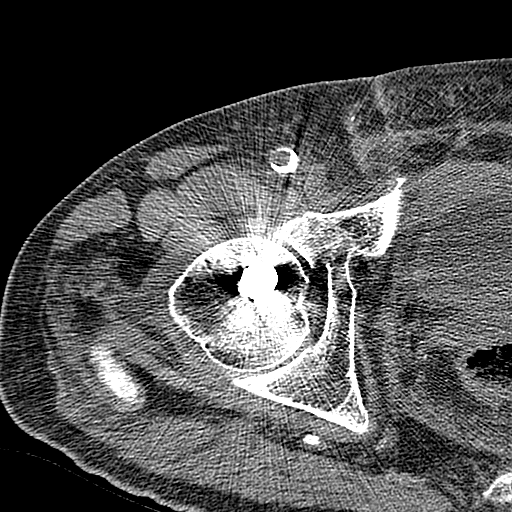
[im 59/79  lung]
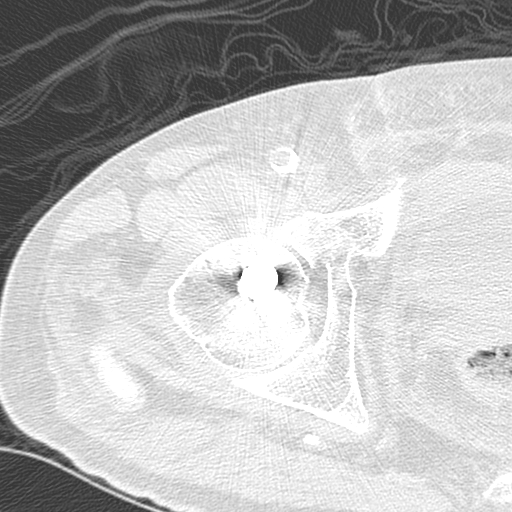
[im 59/79  bone]
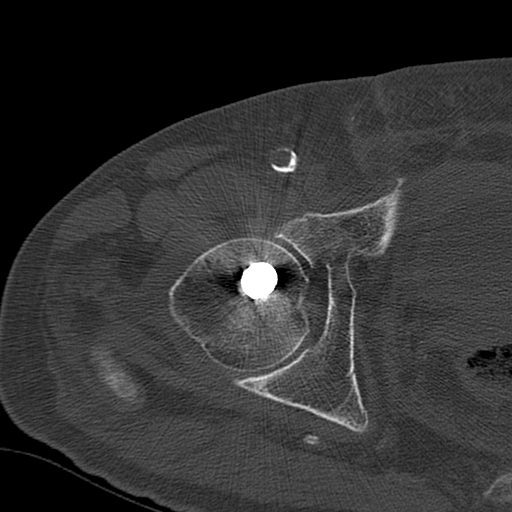
[im 66/79  soft-tissue]
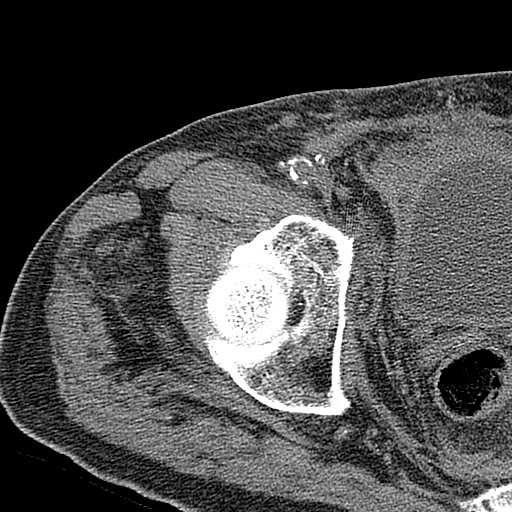
[im 66/79  lung]
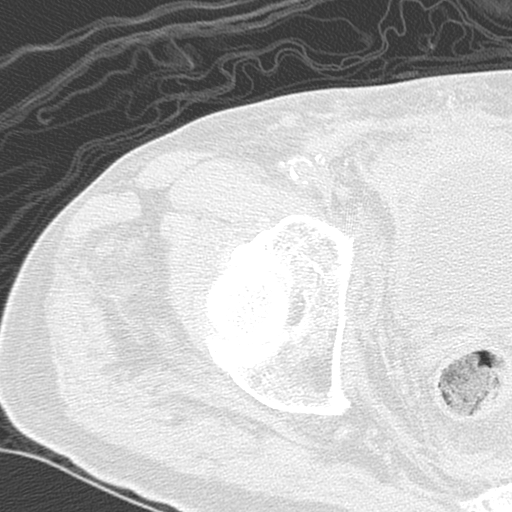
[im 72/79  soft-tissue]
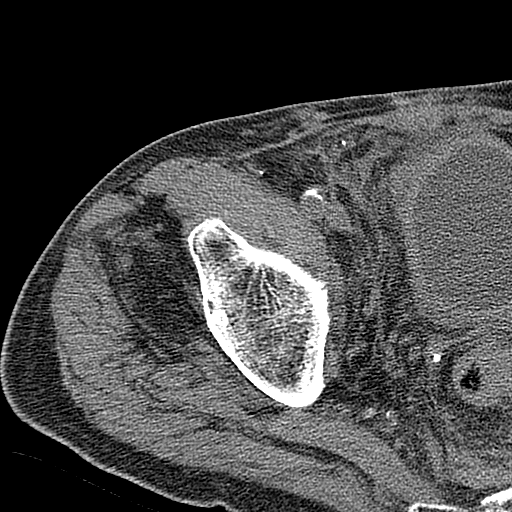
[im 72/79  lung]
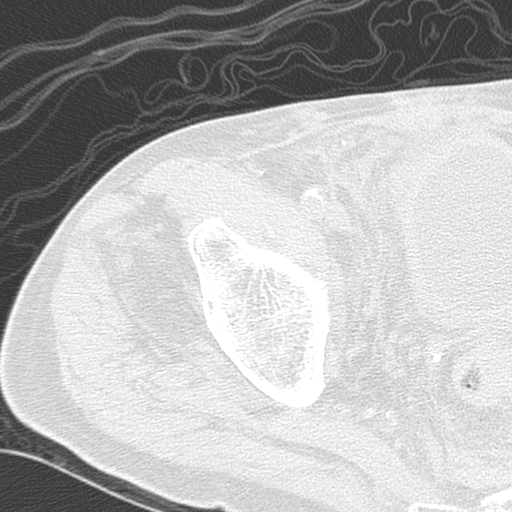

[11 of 32 positions shown; findings below may reference images not displayed]

FINDINGS: Vague linear lucencies with associated cortical break
demonstrated in the anterior and middle zones of the right
acetabulum consistent with acute nondisplaced fractures.  Fracture
lines extends to the anterior hip joint.  The right femur appears
intact.  There is postoperative change with compression screw in
the right femoral head and neck.  No significant soft tissue
infiltration or hematoma.  Degenerative changes of the lumbosacral
interspace.
IMPRESSION: Stable appearing nondisplaced fractures through the anterior and
middle zones of the right acetabulum.

## 2011-10-03 ENCOUNTER — Telehealth: Payer: Self-pay | Admitting: Oncology

## 2011-10-03 NOTE — Telephone Encounter (Signed)
Wife lmonvm for an appt to see FS again and start tx at the request of Dr. Retta Diones. Message forwarded to desk nurse - wife aware. Per wife appt has to be after 8/20 - nurse aware.

## 2011-10-04 ENCOUNTER — Telehealth: Payer: Self-pay | Admitting: *Deleted

## 2011-10-04 NOTE — Telephone Encounter (Signed)
Called [facility] to request patient records from Dr. Lenoria Chime office to be sent to Dr. Clelia Croft.  Spoke with patient, Dr. Clelia Croft will alert schedulers upon receipt of medical records to schedule patient with a follow-up appointment.  Patient verbalized understanding.  PAtient unable to be seen prior to August 20th due to travel conflicts.  Made aware that patient will alert scheduling of conflicts.

## 2011-10-08 ENCOUNTER — Telehealth: Payer: Self-pay | Admitting: Oncology

## 2011-10-08 ENCOUNTER — Telehealth: Payer: Self-pay | Admitting: *Deleted

## 2011-10-08 NOTE — Telephone Encounter (Signed)
Referred by Dr. Retta Diones Dx-Bladder CA. NP packet mailed out.

## 2011-10-08 NOTE — Telephone Encounter (Signed)
Del.Chart to MD office °

## 2011-10-08 NOTE — Telephone Encounter (Signed)
PATIENT'S WIFE CONFIRMED OVER THE PHONE THE NEW DATE AND TIME ON 10-29-2011 STARTING AT 10:30AM

## 2011-10-14 ENCOUNTER — Other Ambulatory Visit: Payer: Self-pay | Admitting: Oncology

## 2011-10-14 DIAGNOSIS — C679 Malignant neoplasm of bladder, unspecified: Secondary | ICD-10-CM

## 2011-10-19 ENCOUNTER — Other Ambulatory Visit: Payer: Self-pay | Admitting: Endocrinology

## 2011-10-29 ENCOUNTER — Ambulatory Visit: Payer: Medicare Other

## 2011-10-29 ENCOUNTER — Encounter: Payer: Self-pay | Admitting: Oncology

## 2011-10-29 ENCOUNTER — Other Ambulatory Visit (HOSPITAL_BASED_OUTPATIENT_CLINIC_OR_DEPARTMENT_OTHER): Payer: Medicare Other | Admitting: Lab

## 2011-10-29 ENCOUNTER — Telehealth: Payer: Self-pay | Admitting: Oncology

## 2011-10-29 ENCOUNTER — Other Ambulatory Visit: Payer: Medicare Other | Admitting: Lab

## 2011-10-29 ENCOUNTER — Ambulatory Visit (HOSPITAL_BASED_OUTPATIENT_CLINIC_OR_DEPARTMENT_OTHER): Payer: Medicare Other | Admitting: Oncology

## 2011-10-29 VITALS — BP 148/72 | HR 66 | Temp 97.7°F | Resp 20 | Ht 72.0 in | Wt 141.3 lb

## 2011-10-29 DIAGNOSIS — D494 Neoplasm of unspecified behavior of bladder: Secondary | ICD-10-CM

## 2011-10-29 DIAGNOSIS — C679 Malignant neoplasm of bladder, unspecified: Secondary | ICD-10-CM

## 2011-10-29 LAB — CBC WITH DIFFERENTIAL/PLATELET
BASO%: 0.3 % (ref 0.0–2.0)
MCHC: 33.4 g/dL (ref 32.0–36.0)
MONO#: 0.8 10*3/uL (ref 0.1–0.9)
RBC: 3.78 10*6/uL — ABNORMAL LOW (ref 4.20–5.82)
WBC: 10.1 10*3/uL (ref 4.0–10.3)
lymph#: 1 10*3/uL (ref 0.9–3.3)

## 2011-10-29 LAB — COMPREHENSIVE METABOLIC PANEL (CC13)
ALT: 18 U/L (ref 0–55)
CO2: 26 mEq/L (ref 22–29)
Calcium: 9.8 mg/dL (ref 8.4–10.4)
Chloride: 104 mEq/L (ref 98–107)
Sodium: 142 mEq/L (ref 136–145)
Total Bilirubin: 0.6 mg/dL (ref 0.20–1.20)
Total Protein: 6.7 g/dL (ref 6.4–8.3)

## 2011-10-29 MED ORDER — ONDANSETRON HCL 8 MG PO TABS: 8.0000 mg | ORAL_TABLET | Freq: Three times a day (TID) | ORAL | Status: AC | PRN

## 2011-10-29 NOTE — Progress Notes (Signed)
Note dictated

## 2011-10-29 NOTE — Telephone Encounter (Signed)
Gave pt appt for September and October, pt also scheduled for chemo class

## 2011-10-29 NOTE — Progress Notes (Signed)
CC:   [name], M.D.  REFERRING PHYSICIAN:  Bertram Millard. Daniels, M.D.  PRINCIPAL DIAGNOSIS AND REASON FOR CONSULTATION:  Superficial bladder tumor.  HISTORY OF PRESENT ILLNESS:  [name] is a pleasant gentleman, native of [place], with history of testicular cancer treated with radiation therapy in the past.  I had seen him back in 2002 for evaluation for his superficial bladder tumor.  His history of that dates back to 2011, when he presented with episodes of painless hematuria and cystoscopy revealed a 1.5-cm papillary tumor in the base of the bladder. He had been treated in the past with BCG and had a BCG re-treatment completed in February 2013.  He was maintained on BCG maintenance up until June 2013.  However, his urine cytology continued to show positive for bladder tumors.  He did have a flexible cystoscopy and visualization back in June 2013.  There was no evidence of any visible tumor; however, the bladder washings did show positive cytology.  The patient was referred to me again for evaluation for possible intravesicular mitomycin-C.  Clinically [name] is a rather frail elderly gentleman.  He has had a fall back in April 2012.  He had a hip fracture that required surgery.  He is diabetic, has lost a lot of weight, and his mobility and performance status remain marginal.  REVIEW OF SYSTEMS:  He does not report any headaches, blurry vision, or double vision.  Does not report any motor or sensory neuropathy.  Does not report any alteration in mental status.  Does not report any psychiatric issues or depression.  Does not report any fever, chills, sweats.  Does not report any cough, hemoptysis, hematemesis.  No nausea or vomiting.  Does not report any abdominal pain.  Rest of review of systems unremarkable.  He did not have any hematuria.  Did not report any genitourinary complaints.  Did not report any dysuria.  He does not have a catheter in  place.  PAST MEDICAL HISTORY:  Significant for diabetes, gout, hypertension, history of testicular cancer, and surgery for his femur.  He has had multiple cystoscopies and fulguration of bladder tumors.  MEDICATIONS:  He is on amlodipine, allopurinol, Humalog, and Lantus.  He is on Lasix, calcitriol, hydralazine, Toprol, simvastatin, and vitamin B12.,  ALLERGIES:  None.  FAMILY HISTORY:  Unremarkable for any malignancy.  SOCIAL HISTORY:  He is married, retired, originally from [place].  Denied any alcohol or tobacco abuse.  PHYSICAL EXAMINATION:  General:  Alert, awake gentleman, appeared in no active distress, frail overall-appearing gentleman.  Vital Signs:  Blood pressure is 148/72, pulse 66, respirations 20, temperature is 97. HEENT:  Head is normocephalic, atraumatic.  Pupils equal, round, and reactive to light.  Oral mucosa moist and pink.  Neck:  Supple without lymphadenopathy.  Heart:  Regular rate and rhythm, S1, S2.  Lungs: Clear to auscultation.  Abdomen:  Soft, nontender.  No hepatosplenomegaly.  Extremities:  No clubbing, cyanosis, or edema. Neurologically:  Intact motor, sensory, and deep tendon reflexes.  LABORATORY DATA:  Showed a hemoglobin of 12.3, white cell count of 10, platelet count of 121.  His chemistries are currently pending, but his last creatinine back in December 2012 was 2.42 with a creatinine clearance of about 25 mL per minute.  ASSESSMENT AND PLAN:  A 77 year old gentleman with the following issues:  Superficial bladder tumor diagnosed in 2011.  He has had transurethral resection of bladder tumor, as well as BCG on 2 separate occasions.  He  has continued to have persistent positive urine cytology.  The patient was referred to me for evaluation for intravesicular mitomycin-C.  Risks and benefits of this approach were discussed today with [name]. The big question is regarding his kidney function.  His creatinine clearance is marginal.  We  will have to make sure that the intravesicular mitomycin-C does not have any systemic renal involvement. I will research this particular question, whether his creatinine clearance at 25 mL per minute is a contraindication for using intravesicular mitomycin-C.  If it is not, then we will go ahead and proceed with the administration sometime next week.  Risks and benefits were discussed today.  Toxicities including nausea, vomiting, fatigue, weakness were all discussed.  He is ready to proceed at this time.  I have given him a prescription for Zofran.  I also set him up with the chemo education class.  I have also elicited a pharmacy consult regarding his particular questions.  I will evaluate him halfway through it.  Again, the plan is to proceed with 6 treatments and we will have low tolerance of stopping this treatment if he has any complications.    ______________________________ [name], M.D. FNS/MEDQ  D:  10/29/2011  T:  10/29/2011  Job:  [id number]

## 2011-10-29 NOTE — Progress Notes (Signed)
Pt checked in.  He has [employer] ins. Pt didn't have any financial concerns at this time.

## 2011-10-31 ENCOUNTER — Other Ambulatory Visit: Payer: Medicare Other

## 2011-10-31 ENCOUNTER — Encounter: Payer: Self-pay | Admitting: *Deleted

## 2011-10-31 ENCOUNTER — Telehealth: Payer: Self-pay | Admitting: *Deleted

## 2011-10-31 NOTE — Telephone Encounter (Signed)
Phone call to wife.

## 2011-11-04 ENCOUNTER — Other Ambulatory Visit: Payer: Self-pay | Admitting: Endocrinology

## 2011-11-05 ENCOUNTER — Ambulatory Visit (HOSPITAL_BASED_OUTPATIENT_CLINIC_OR_DEPARTMENT_OTHER): Payer: Medicare Other

## 2011-11-05 ENCOUNTER — Other Ambulatory Visit (HOSPITAL_BASED_OUTPATIENT_CLINIC_OR_DEPARTMENT_OTHER): Payer: Medicare Other | Admitting: Lab

## 2011-11-05 VITALS — BP 141/78 | HR 64 | Temp 97.1°F

## 2011-11-05 DIAGNOSIS — Z5111 Encounter for antineoplastic chemotherapy: Secondary | ICD-10-CM

## 2011-11-05 DIAGNOSIS — C679 Malignant neoplasm of bladder, unspecified: Secondary | ICD-10-CM

## 2011-11-05 DIAGNOSIS — D090 Carcinoma in situ of bladder: Secondary | ICD-10-CM

## 2011-11-05 LAB — CBC WITH DIFFERENTIAL/PLATELET
BASO%: 0.5 % (ref 0.0–2.0)
EOS%: 5 % (ref 0.0–7.0)
LYMPH%: 9.4 % — ABNORMAL LOW (ref 14.0–49.0)
MCHC: 33.6 g/dL (ref 32.0–36.0)
MONO#: 0.7 10*3/uL (ref 0.1–0.9)
Platelets: 135 10*3/uL — ABNORMAL LOW (ref 140–400)
RBC: 3.7 10*6/uL — ABNORMAL LOW (ref 4.20–5.82)
WBC: 10 10*3/uL (ref 4.0–10.3)

## 2011-11-05 LAB — COMPREHENSIVE METABOLIC PANEL (CC13)
ALT: 27 U/L (ref 0–55)
AST: 23 U/L (ref 5–34)
Alkaline Phosphatase: 93 U/L (ref 40–150)
CO2: 25 mEq/L (ref 22–29)
Sodium: 142 mEq/L (ref 136–145)
Total Bilirubin: 0.5 mg/dL (ref 0.20–1.20)
Total Protein: 6.4 g/dL (ref 6.4–8.3)

## 2011-11-05 MED ORDER — LIDOCAINE HCL 2 % EX GEL
Freq: Once | CUTANEOUS | Status: AC
  Administered 2011-11-05: 1 via URETHRAL
  Filled 2011-11-05: qty 20

## 2011-11-05 MED ORDER — SODIUM CHLORIDE 0.9 % IV SOLN
Freq: Once | INTRAVENOUS | Status: AC
  Administered 2011-11-05: 09:00:00 via INTRAVENOUS

## 2011-11-05 MED ORDER — ONDANSETRON 8 MG/50ML IVPB (CHCC)
8.0000 mg | Freq: Once | INTRAVENOUS | Status: AC
  Administered 2011-11-05: 8 mg via INTRAVENOUS

## 2011-11-05 MED ORDER — DEXAMETHASONE SODIUM PHOSPHATE 10 MG/ML IJ SOLN
10.0000 mg | Freq: Once | INTRAMUSCULAR | Status: AC
  Administered 2011-11-05: 10 mg via INTRAVENOUS

## 2011-11-05 MED ORDER — MITOMYCIN CHEMO FOR BLADDER INSTILLATION 20 MG
20.0000 mg | Freq: Once | INTRAVENOUS | Status: AC
  Administered 2011-11-05: 20 mg via INTRAVESICAL
  Filled 2011-11-05: qty 40

## 2011-11-05 NOTE — Progress Notes (Signed)
1013-  Mitomycin pushed slowly into bladder through foley catheter over 5 minutes.  Pt tolerated well without complaints.  Foley catheter then clamped and pt instructed to rotate on all sides every 15 minutes for 2 hours, and notify RN immediately if he has any pain, spasms, or uncomfortable feelings.

## 2011-11-05 NOTE — Progress Notes (Signed)
1610- 16 French foley catheter inserted by Daivd Council, NT and Drue Dun, NT without difficulty, set to drain by gravity to bag.   250 ml clear, yellow urine obtained. Pt tolerated well.

## 2011-11-05 NOTE — Patient Instructions (Addendum)
[facility] Discharge Instructions for Patients Receiving Chemotherapy  Today you received the following chemotherapy agents : Mitomycin (in the bladder)  To help prevent nausea and vomiting after your treatment, we encourage you to take your nausea medication as directed by your MD. If you develop nausea and vomiting that is not controlled by your nausea medication, call the clinic. If it is after clinic hours your family physician or the after hours number for the clinic or go to the Emergency Department.   BELOW ARE SYMPTOMS THAT SHOULD BE REPORTED IMMEDIATELY:  *FEVER GREATER THAN 100.5 F  *CHILLS WITH OR WITHOUT FEVER  NAUSEA AND VOMITING THAT IS NOT CONTROLLED WITH YOUR NAUSEA MEDICATION  *UNUSUAL SHORTNESS OF BREATH  *UNUSUAL BRUISING OR BLEEDING  TENDERNESS IN MOUTH AND THROAT WITH OR WITHOUT PRESENCE OF ULCERS  *URINARY PROBLEMS  *BOWEL PROBLEMS  UNUSUAL RASH Items with * indicate a potential emergency and should be followed up as soon as possible.  One of the nurses will contact you 24 hours after your treatment. Please let the nurse know about any problems that you may have experienced. Feel free to call the clinic you have any questions or concerns. The clinic phone number is [phone].   * Drink plenty of fluids over the next 24-48 hours.  * When using the bathroom for the next 48 hours, sit down to so, and flush twice with the lid closed (to avoid splashing and get rid of any chemo residue).

## 2011-11-05 NOTE — Progress Notes (Signed)
1235- Foley catheter drained, with 150 ml dark yellow urine for a total of 400 ml.  Foley d/c'ed, and pt tolerated procedure well w/o complaints.

## 2011-11-06 ENCOUNTER — Telehealth: Payer: Self-pay | Admitting: *Deleted

## 2011-11-06 NOTE — Telephone Encounter (Signed)
Called to speak with patient, he was not in, wife states that patient is out and about today, but seems to be doing fine. He did complain to her that he is not urinating "as much", but he is urinating. Wife states she believes he is slightly dehydrated, she will be forcing fluids when he returns home. Instructed wife to please inform patient if urine ceases, or he appears to be having lower abdominal pain with distention, they should call us. She verbalized understanding.

## 2011-11-06 NOTE — Telephone Encounter (Signed)
Patient called with concerns that blood sugar was elevated last night at 402 and this a.m. at 273.  It was advised to patient that likely the cause of these elevated readings are due to receiving 10mg  dexamethasone with last treatment on 11/05/2011.  If levels do not return to normal within the next couple days, patient knows to call us.  Dr. Clelia Croft also aware of this.  Patient verbalized understanding.

## 2011-11-12 ENCOUNTER — Other Ambulatory Visit: Payer: Self-pay | Admitting: Oncology

## 2011-11-12 ENCOUNTER — Other Ambulatory Visit (HOSPITAL_BASED_OUTPATIENT_CLINIC_OR_DEPARTMENT_OTHER): Payer: Medicare Other | Admitting: Lab

## 2011-11-12 ENCOUNTER — Ambulatory Visit (HOSPITAL_BASED_OUTPATIENT_CLINIC_OR_DEPARTMENT_OTHER): Payer: Medicare Other

## 2011-11-12 VITALS — BP 151/68 | HR 65 | Temp 97.0°F

## 2011-11-12 DIAGNOSIS — Z5111 Encounter for antineoplastic chemotherapy: Secondary | ICD-10-CM

## 2011-11-12 DIAGNOSIS — C679 Malignant neoplasm of bladder, unspecified: Secondary | ICD-10-CM

## 2011-11-12 LAB — COMPREHENSIVE METABOLIC PANEL (CC13)
CO2: 27 mEq/L (ref 22–29)
Calcium: 9.5 mg/dL (ref 8.4–10.4)
Chloride: 107 mEq/L (ref 98–107)
Creatinine: 2.6 mg/dL — ABNORMAL HIGH (ref 0.7–1.3)
Glucose: 204 mg/dl — ABNORMAL HIGH (ref 70–99)
Total Bilirubin: 0.6 mg/dL (ref 0.20–1.20)
Total Protein: 6.6 g/dL (ref 6.4–8.3)

## 2011-11-12 LAB — CBC WITH DIFFERENTIAL/PLATELET
BASO%: 0.4 % (ref 0.0–2.0)
EOS%: 3.8 % (ref 0.0–7.0)
LYMPH%: 9 % — ABNORMAL LOW (ref 14.0–49.0)
MCH: 32.8 pg (ref 27.2–33.4)
MCHC: 33.8 g/dL (ref 32.0–36.0)
MCV: 97 fL (ref 79.3–98.0)
MONO%: 7.9 % (ref 0.0–14.0)
Platelets: 134 10*3/uL — ABNORMAL LOW (ref 140–400)
RBC: 3.8 10*6/uL — ABNORMAL LOW (ref 4.20–5.82)
WBC: 10.2 10*3/uL (ref 4.0–10.3)

## 2011-11-12 MED ORDER — MITOMYCIN CHEMO FOR BLADDER INSTILLATION 20 MG
20.0000 mg | Freq: Once | INTRAVENOUS | Status: AC
  Administered 2011-11-12: 20 mg via INTRAVESICAL
  Filled 2011-11-12: qty 40

## 2011-11-12 MED ORDER — ONDANSETRON HCL 8 MG PO TABS
8.0000 mg | ORAL_TABLET | Freq: Once | ORAL | Status: AC
  Administered 2011-11-12: 8 mg via ORAL

## 2011-11-12 MED ORDER — LIDOCAINE HCL 2 % EX GEL
Freq: Once | CUTANEOUS | Status: AC
  Administered 2011-11-12: 20 via URETHRAL
  Filled 2011-11-12: qty 20

## 2011-11-12 NOTE — Progress Notes (Signed)
Pt. arrived for second cycle of intravesicular chemo today.  Pt denied nausea post last tmt.  C/o'ed of increase blood sugar >300-400 lasting 3 to 4 days post last tmt.   Pt. Has been using insulin sliding scale at home.  Discuss this with Dr. Clelia Croft, order received to remove Decadron from tmt plan beginning today.  Since pt. Is not receiving both IV premed, per Dr. Clelia Croft, okay to give zofran dose orally as premeds. MD will change future orders in tmt plan.  NO other issues post last tmt.   Foley drained ~125 mL of gray/blue fluid.  Foley d/c'd without complications.  Patient without complaints at this time. AD

## 2011-11-12 NOTE — Progress Notes (Signed)
#   16 foley inserted by Daivd Council NT, 200 ml clear urine noted. Pt. Tolerated procedure well.  Mitomycin chemo instilled into foley and pt. Monitored with turning q . HL 200 Urine output from foley catheter prior to chemo infusion.  Chemo dwell time of two hours.  125 output to foley bag after infusion for a total volume out of 325.  Resulting urine was dark gray in color and clear with no signs of blood.  Patient tolerated treatment well.  Discharged to home.

## 2011-11-12 NOTE — Patient Instructions (Addendum)
[facility] Discharge Instructions for Patients Receiving Chemotherapy  Today you received the following chemotherapy agents Mitomycin To help prevent nausea and vomiting after your treatment, we encourage you to take your nausea medication  As per Dr. Clelia Croft.   If you develop nausea and vomiting that is not controlled by your nausea medication, call the clinic. If it is after clinic hours your family physician or the after hours number for the clinic or go to the Emergency Department.   BELOW ARE SYMPTOMS THAT SHOULD BE REPORTED IMMEDIATELY:  *FEVER GREATER THAN 100.5 F  *CHILLS WITH OR WITHOUT FEVER  NAUSEA AND VOMITING THAT IS NOT CONTROLLED WITH YOUR NAUSEA MEDICATION  *UNUSUAL SHORTNESS OF BREATH  *UNUSUAL BRUISING OR BLEEDING  TENDERNESS IN MOUTH AND THROAT WITH OR WITHOUT PRESENCE OF ULCERS  *URINARY PROBLEMS  *BOWEL PROBLEMS  UNUSUAL RASH Items with * indicate a potential emergency and should be followed up as soon as possible.   . Feel free to call the clinic you have any questions or concerns. The clinic phone number is [phone].   I have been informed and understand all the instructions given to me. I know to contact the clinic, my physician, or go to the Emergency Department if any problems should occur. I do not have any questions at this time, but understand that I may call the clinic during office hours   should I have any questions or need assistance in obtaining follow up care.    __________________________________________  _____________  __________ Signature of Patient or Authorized Representative            Date                   Time    __________________________________________ Nurse's Signature

## 2011-11-19 ENCOUNTER — Telehealth: Payer: Self-pay | Admitting: Oncology

## 2011-11-19 ENCOUNTER — Ambulatory Visit (HOSPITAL_BASED_OUTPATIENT_CLINIC_OR_DEPARTMENT_OTHER): Payer: Medicare Other

## 2011-11-19 ENCOUNTER — Ambulatory Visit (HOSPITAL_BASED_OUTPATIENT_CLINIC_OR_DEPARTMENT_OTHER): Payer: Medicare Other | Admitting: Oncology

## 2011-11-19 ENCOUNTER — Other Ambulatory Visit (HOSPITAL_BASED_OUTPATIENT_CLINIC_OR_DEPARTMENT_OTHER): Payer: Medicare Other | Admitting: Lab

## 2011-11-19 ENCOUNTER — Encounter: Payer: Self-pay | Admitting: Oncology

## 2011-11-19 VITALS — BP 116/55 | HR 65 | Temp 96.9°F | Resp 22 | Ht 72.0 in | Wt 144.6 lb

## 2011-11-19 DIAGNOSIS — Z5111 Encounter for antineoplastic chemotherapy: Secondary | ICD-10-CM

## 2011-11-19 DIAGNOSIS — C679 Malignant neoplasm of bladder, unspecified: Secondary | ICD-10-CM

## 2011-11-19 DIAGNOSIS — E119 Type 2 diabetes mellitus without complications: Secondary | ICD-10-CM

## 2011-11-19 DIAGNOSIS — N289 Disorder of kidney and ureter, unspecified: Secondary | ICD-10-CM

## 2011-11-19 DIAGNOSIS — I10 Essential (primary) hypertension: Secondary | ICD-10-CM

## 2011-11-19 LAB — COMPREHENSIVE METABOLIC PANEL (CC13)
ALT: 27 U/L (ref 0–55)
Albumin: 3.6 g/dL (ref 3.5–5.0)
CO2: 25 mEq/L (ref 22–29)
Calcium: 10 mg/dL (ref 8.4–10.4)
Chloride: 110 mEq/L — ABNORMAL HIGH (ref 98–107)
Glucose: 173 mg/dl — ABNORMAL HIGH (ref 70–99)
Sodium: 145 mEq/L (ref 136–145)
Total Bilirubin: 0.5 mg/dL (ref 0.20–1.20)
Total Protein: 6.6 g/dL (ref 6.4–8.3)

## 2011-11-19 LAB — CBC WITH DIFFERENTIAL/PLATELET
BASO%: 0.5 % (ref 0.0–2.0)
Eosinophils Absolute: 0.6 10*3/uL — ABNORMAL HIGH (ref 0.0–0.5)
HCT: 36.3 % — ABNORMAL LOW (ref 38.4–49.9)
LYMPH%: 9.1 % — ABNORMAL LOW (ref 14.0–49.0)
MONO#: 0.7 10*3/uL (ref 0.1–0.9)
NEUT#: 8.8 10*3/uL — ABNORMAL HIGH (ref 1.5–6.5)
Platelets: 124 10*3/uL — ABNORMAL LOW (ref 140–400)
RBC: 3.72 10*6/uL — ABNORMAL LOW (ref 4.20–5.82)
WBC: 11.1 10*3/uL — ABNORMAL HIGH (ref 4.0–10.3)
lymph#: 1 10*3/uL (ref 0.9–3.3)

## 2011-11-19 MED ORDER — LIDOCAINE HCL 2 % EX GEL
Freq: Once | CUTANEOUS | Status: AC
  Administered 2011-11-19: 10:00:00 via URETHRAL
  Filled 2011-11-19: qty 20

## 2011-11-19 MED ORDER — MITOMYCIN CHEMO FOR BLADDER INSTILLATION 20 MG
20.0000 mg | Freq: Once | INTRAVENOUS | Status: AC
  Administered 2011-11-19: 20 mg via INTRAVESICAL
  Filled 2011-11-19: qty 40

## 2011-11-19 MED ORDER — SODIUM CHLORIDE 0.9 % IV SOLN: Freq: Once | INTRAVENOUS | Status: DC

## 2011-11-19 MED ORDER — ONDANSETRON HCL 8 MG PO TABS
8.0000 mg | ORAL_TABLET | Freq: Once | ORAL | Status: AC
  Administered 2011-11-19: 8 mg via ORAL

## 2011-11-19 NOTE — Patient Instructions (Addendum)
Mitomycin injection What is this medicine? MITOMYCIN (mye toe MYE sin) is a chemotherapy drug. This medicine is used to treat cancer of the stomach and pancreas. This medicine may be used for other purposes; ask your health care provider or pharmacist if you have questions. What should I tell my health care provider before I take this medicine? They need to know if you have any of these conditions: -anemia -bleeding disorder -infection (especially a virus infection such as chickenpox, cold sores, or herpes) -kidney disease -low blood counts like low platelets, red blood cells, white blood cells -recent radiation therapy -an unusual or allergic reaction to mitomycin, other chemotherapy agents, other medicines, foods, dyes, or preservatives -pregnant or trying to get pregnant -breast-feeding How should I use this medicine? This drug is given as an injection or infusion into a vein. It is administered in a hospital or clinic by a specially trained health care professional. Talk to your pediatrician regarding the use of this medicine in children. Special care may be needed. Overdosage: If you think you have taken too much of this medicine contact a poison control center or emergency room at once. NOTE: This medicine is only for you. Do not share this medicine with others. What if I miss a dose? It is important not to miss your dose. Call your doctor or health care professional if you are unable to keep an appointment. What may interact with this medicine? -medicines to increase blood counts like filgrastim, pegfilgrastim, sargramostim -vaccines This list may not describe all possible interactions. Give your health care provider a list of all the medicines, herbs, non-prescription drugs, or dietary supplements you use. Also tell them if you smoke, drink alcohol, or use illegal drugs. Some items may interact with your medicine. What should I watch for while using this medicine? Your condition  will be monitored carefully while you are receiving this medicine. You will need important blood work done while you are taking this medicine. This drug may make you feel generally unwell. This is not uncommon, as chemotherapy can affect healthy cells as well as cancer cells. Report any side effects. Continue your course of treatment even though you feel ill unless your doctor tells you to stop. Call your doctor or health care professional for advice if you get a fever, chills or sore throat, or other symptoms of a cold or flu. Do not treat yourself. This drug decreases your body's ability to fight infections. Try to avoid being around people who are sick. This medicine may increase your risk to bruise or bleed. Call your doctor or health care professional if you notice any unusual bleeding. Be careful brushing and flossing your teeth or using a toothpick because you may get an infection or bleed more easily. If you have any dental work done, tell your dentist you are receiving this medicine. Avoid taking products that contain aspirin, acetaminophen, ibuprofen, naproxen, or ketoprofen unless instructed by your doctor. These medicines may hide a fever. Do not become pregnant while taking this medicine. Women should inform their doctor if they wish to become pregnant or think they might be pregnant. There is a potential for serious side effects to an unborn child. Talk to your health care professional or pharmacist for more information. Do not breast-feed an infant while taking this medicine. What side effects may I notice from receiving this medicine? Side effects that you should report to your doctor or health care professional as soon as possible: -allergic reactions like skin rash, itching   or hives, swelling of the face, lips, or tongue -low blood counts - this medicine may decrease the number of white blood cells, red blood cells and platelets. You may be at increased risk for infections and  bleeding. -signs of infection - fever or chills, cough, sore throat, pain or difficulty passing urine -signs of decreased platelets or bleeding - bruising, pinpoint red spots on the skin, black, tarry stools, blood in the urine -signs of decreased red blood cells - unusually weak or tired, fainting spells, lightheadedness -breathing problems -changes in vision -chest pain -confusion -dry cough -high blood pressure -mouth sores -pain, swelling, redness at site where injected -pain, tingling, numbness in the hands or feet -seizures -swelling of the ankles, feet, hands -trouble passing urine or change in the amount of urine Side effects that usually do not require medical attention (report to your doctor or health care professional if they continue or are bothersome): -diarrhea -green to blue color of urine -hair loss -loss of appetite -nausea, vomiting This list may not describe all possible side effects. Call your doctor for medical advice about side effects. You may report side effects to FDA at 1-800-FDA-1088. Where should I keep my medicine? This drug is given in a hospital or clinic and will not be stored at home. NOTE: This sheet is a summary. It may not cover all possible information. If you have questions about this medicine, talk to your doctor, pharmacist, or health care provider.  2012, [employer]. (08/20/2007 11:16:23 AM)

## 2011-11-19 NOTE — Progress Notes (Signed)
At approximately 1020, foley catheter inserted on patient with no reported difficulty.  Approximately 200 cc clear yellow urine drained from bladder.  Mutamycin injected into the bladder via foley at 1045.  Patient was instructed to turn in various positions every 15 minutes with assistance.  Patient unable to fully lay on stomach d/t discomfort.  At approximately 1235, chemo drained from bladder via foley.  Total volume drained was approximately 100 ml's.  Pt tolerated procedure without difficulty.  Pt discharged after the fact.  AVS given.

## 2011-11-19 NOTE — Progress Notes (Signed)
Hematology and Oncology Follow Up Visit  Devrin Athey [id number] 1934-09-28 77 y.o. 11/19/2011 10:35 AM Neldon Labella, MDMiller, Misty Stanley, MD    Principle Diagnosis: Superficial bladder tumor.  Prior Therapy: BCG treatment x 2 in the past. Treatment was completed in June 2013.  Current therapy: Weekly intra vesicular Mitomycin-C. Today is week 3 of 6.  Interim History:  Mr Arnhold is seen for routine follow-up prior to his Mitomycin today. Reports that he is tolerating this well. Denies and bladder pain or spasms. He has not noticed any hematuria or any other bleeding. No dysuria. Denies chest pain, shortness of breath, abdominal pain, nausea, or vomiting.   Medications: I have reviewed the patient's current medications. Current outpatient prescriptions:allopurinol (ZYLOPRIM) 100 MG tablet, Take 100 mg by mouth daily. , Disp: , Rfl: ;  aspirin 81 MG tablet, Take 81 mg by mouth daily., Disp: , Rfl: ;  furosemide (LASIX) 40 MG tablet, Take 40 mg by mouth every morning. , Disp: , Rfl: ;  hydrALAZINE (APRESOLINE) 25 MG tablet, Take 25 mg by mouth 3 (three) times daily. , Disp: , Rfl:  insulin aspart (NOVOLOG) 100 UNIT/ML injection, Inject 3 Units into the skin 3 (three) times daily before meals.  , Disp: , Rfl: ;  insulin glargine (LANTUS) 100 UNIT/ML injection, Inject 10 Units into the skin every morning.  , Disp: , Rfl: ;  Insulin Pen Needle 31G X 8 MM MISC, Inject 1 Device into the skin 4 (four) times daily., Disp: 100 each, Rfl: 11 metoprolol succinate (TOPROL-XL) 100 MG 24 hr tablet, TAKE 1 TABLET BY MOUTH DAILY, Disp: 30 tablet, Rfl: 2;  simvastatin (ZOCOR) 20 MG tablet, Take 20 mg by mouth every morning. , Disp: , Rfl: ;  vitamin B-12 (CYANOCOBALAMIN) 500 MCG tablet, Take 500 mcg by mouth daily.  , Disp: , Rfl:  No current facility-administered medications for this visit. Facility-Administered Medications Ordered in Other Visits: 0.9 %  sodium chloride infusion, , Intravenous, Once, Benjiman Core, MD;  lidocaine (XYLOCAINE) 2 % jelly, , Urethral, Once, Benjiman Core, MD;  mitoMYcin [facility]) chemo injection 20 mg, 20 mg, Bladder Instillation, Once, Benjiman Core, MD;  ondansetron [facility]) tablet 8 mg, 8 mg, Oral, Once, Benjiman Core, MD, 8 mg at 11/19/11 1010  Allergies: No Known Allergies  Past Medical History, Surgical history, Social history, and Family History were reviewed and updated.  Review of Systems: Constitutional:  Negative for fever, chills, night sweats, anorexia, weight loss, pain. Cardiovascular: no chest pain or dyspnea on exertion Respiratory: no cough, shortness of breath, or wheezing Neurological: no TIA or stroke symptoms Dermatological: negative ENT: negative Skin: Negative. Gastrointestinal: no abdominal pain, change in bowel habits, or black or bloody stools Genito-Urinary: no dysuria, trouble voiding, or hematuria Hematological and Lymphatic: negative Breast: negative for breast lumps Musculoskeletal: negative Remaining ROS negative. Physical Exam: Blood pressure 116/55, pulse 65, temperature 96.9 F (36.1 C), temperature source Oral, resp. rate 22, height 6' (1.829 m), weight 144 lb 9.6 oz (65.59 kg). ECOG:  General appearance: alert, cooperative and no distress Head: Normocephalic, without obvious abnormality, atraumatic Neck: no adenopathy, no carotid bruit, no JVD, supple, symmetrical, trachea midline and thyroid not enlarged, symmetric, no tenderness/mass/nodules Lymph nodes: Cervical, supraclavicular, and axillary nodes normal. Heart:regular rate and rhythm, S1, S2 normal, no murmur, click, rub or gallop Lung:chest clear, no wheezing, rales, normal symmetric air entry, no tachypnea, retractions or cyanosis Abdomen: soft, non-tender, without masses or organomegaly EXT:no erythema, induration, or nodules  Lab Results: Lab Results  Component Value Date   WBC 11.1* 11/19/2011   HGB 11.9* 11/19/2011   HCT 36.3* 11/19/2011   MCV 97.5  11/19/2011   PLT 124* 11/19/2011     Chemistry      Component Value Date/Time   NA 145 11/19/2011 0854   NA 140 02/13/2011 1050   K 5.2* 11/19/2011 0854   K 4.9 02/13/2011 1050   CL 110* 11/19/2011 0854   CL 103 02/13/2011 1050   CO2 25 11/19/2011 0854   CO2 29 02/13/2011 1050   BUN 70.0* 11/19/2011 0854   BUN 83* 02/13/2011 1050   CREATININE 2.4* 11/19/2011 0854   CREATININE 2.42* 02/13/2011 1050      Component Value Date/Time   CALCIUM 10.0 11/19/2011 0854   CALCIUM 10.7* 02/13/2011 1050   ALKPHOS 87 11/19/2011 0854   ALKPHOS 95 08/09/2010 1635   AST 24 11/19/2011 0854   AST 24 08/09/2010 1635   ALT 27 11/19/2011 0854   ALT 37 08/09/2010 1635   BILITOT 0.50 11/19/2011 0854   BILITOT 0.2* 08/09/2010 1635     Impression and Plan: This is a 77 year old gentleman with the following issues: 1. Superficial bladder tumor: On weekly intra vesicular Mitomycin-C. Tolerating this well. Recommend that he continue Mitomycin without dose modification. Plan is for a total of 6 weekly treatments.   2. History of testicular cancer: S/P XRT in 2002. No signs of recurrence.  3. HTN: On amlodipine and Toprol, hydralazine, and Lasix per PCP.  4. Hyperlipidemia: On simvastatin per PCP.  5. Diabetes: On Humalog and Lantus insulin per PCP.  6. Renal insufficiency, due to DM and HTN. Baseline creatinine is ~2.5. This has remained stable.  7. Follow-up: On 10/15 prior to 6 th dose of Mitomycin-C.  Spent more than half the time coordinating care.    [place]ie [place], [place] 9/24/201310:35 AM

## 2011-11-19 NOTE — Telephone Encounter (Signed)
gv pt appt schedule for October. Per 9/24 pof cx'd appts for 10/22 and 10/29.

## 2011-11-26 ENCOUNTER — Other Ambulatory Visit (HOSPITAL_BASED_OUTPATIENT_CLINIC_OR_DEPARTMENT_OTHER): Payer: Medicare Other

## 2011-11-26 ENCOUNTER — Ambulatory Visit (HOSPITAL_BASED_OUTPATIENT_CLINIC_OR_DEPARTMENT_OTHER): Payer: Medicare Other

## 2011-11-26 VITALS — BP 139/68 | HR 60 | Temp 96.9°F

## 2011-11-26 DIAGNOSIS — N289 Disorder of kidney and ureter, unspecified: Secondary | ICD-10-CM

## 2011-11-26 DIAGNOSIS — Z5111 Encounter for antineoplastic chemotherapy: Secondary | ICD-10-CM

## 2011-11-26 DIAGNOSIS — C679 Malignant neoplasm of bladder, unspecified: Secondary | ICD-10-CM

## 2011-11-26 LAB — CBC WITH DIFFERENTIAL/PLATELET
BASO%: 0.4 % (ref 0.0–2.0)
EOS%: 6.2 % (ref 0.0–7.0)
MCH: 31.3 pg (ref 27.2–33.4)
MCHC: 33 g/dL (ref 32.0–36.0)
MONO#: 0.6 10*3/uL (ref 0.1–0.9)
RDW: 14.3 % (ref 11.0–14.6)
WBC: 8.4 10*3/uL (ref 4.0–10.3)
lymph#: 1.2 10*3/uL (ref 0.9–3.3)
nRBC: 0 % (ref 0–0)

## 2011-11-26 LAB — COMPREHENSIVE METABOLIC PANEL (CC13)
ALT: 23 U/L (ref 0–55)
AST: 21 U/L (ref 5–34)
Albumin: 3.2 g/dL — ABNORMAL LOW (ref 3.5–5.0)
Alkaline Phosphatase: 80 U/L (ref 40–150)
Chloride: 110 mEq/L — ABNORMAL HIGH (ref 98–107)
Potassium: 5 mEq/L (ref 3.5–5.1)
Sodium: 140 mEq/L (ref 136–145)
Total Protein: 5.7 g/dL — ABNORMAL LOW (ref 6.4–8.3)

## 2011-11-26 MED ORDER — ONDANSETRON HCL 8 MG PO TABS
8.0000 mg | ORAL_TABLET | Freq: Once | ORAL | Status: AC
  Administered 2011-11-26: 8 mg via ORAL

## 2011-11-26 MED ORDER — LIDOCAINE HCL 2 % EX GEL
Freq: Once | CUTANEOUS | Status: AC
  Administered 2011-11-26: 20 via URETHRAL
  Filled 2011-11-26: qty 20

## 2011-11-26 MED ORDER — MITOMYCIN CHEMO FOR BLADDER INSTILLATION 20 MG
20.0000 mg | Freq: Once | INTRAVENOUS | Status: AC
  Administered 2011-11-26: 20 mg via INTRAVESICAL
  Filled 2011-11-26: qty 40

## 2011-11-26 NOTE — Patient Instructions (Signed)
[facility] Discharge Instructions for Patients Receiving Chemotherapy  Today you received the following chemotherapy agents Mitomycin  To help prevent nausea and vomiting after your treatment, we encourage you to take your nausea medication as directed by your MD. If you develop nausea and vomiting that is not controlled by your nausea medication, call the clinic. If it is after clinic hours your family physician or the after hours number for the clinic or go to the Emergency Department.   BELOW ARE SYMPTOMS THAT SHOULD BE REPORTED IMMEDIATELY:  *FEVER GREATER THAN 100.5 F  *CHILLS WITH OR WITHOUT FEVER  NAUSEA AND VOMITING THAT IS NOT CONTROLLED WITH YOUR NAUSEA MEDICATION  *UNUSUAL SHORTNESS OF BREATH  *UNUSUAL BRUISING OR BLEEDING  TENDERNESS IN MOUTH AND THROAT WITH OR WITHOUT PRESENCE OF ULCERS  *URINARY PROBLEMS  *BOWEL PROBLEMS  UNUSUAL RASH Items with * indicate a potential emergency and should be followed up as soon as possible.   Feel free to call the clinic you have any questions or concerns. The clinic phone number is [phone].

## 2011-11-26 NOTE — Progress Notes (Signed)
1100- 16 French foley catheter placed by Drue Dun, NT without difficulty.  350 ml clear yellow urine obtained. 1106- Mitomycin administered slow IV push into bladder through foley catheter.  Pt instructed to turn from back, side to side every 15 minutes for 2 hours total.  1345- Foley drained with 250 ml blue tinged clear urine.  Foley d/c'ed.  Pt tolerated entire procedure well with no complaints.

## 2011-12-03 ENCOUNTER — Ambulatory Visit (HOSPITAL_BASED_OUTPATIENT_CLINIC_OR_DEPARTMENT_OTHER): Payer: Medicare Other

## 2011-12-03 ENCOUNTER — Other Ambulatory Visit (HOSPITAL_BASED_OUTPATIENT_CLINIC_OR_DEPARTMENT_OTHER): Payer: Medicare Other | Admitting: Lab

## 2011-12-03 VITALS — BP 135/59 | HR 67 | Temp 98.1°F

## 2011-12-03 DIAGNOSIS — Z5111 Encounter for antineoplastic chemotherapy: Secondary | ICD-10-CM

## 2011-12-03 DIAGNOSIS — C679 Malignant neoplasm of bladder, unspecified: Secondary | ICD-10-CM

## 2011-12-03 LAB — COMPREHENSIVE METABOLIC PANEL (CC13)
ALT: 23 U/L (ref 0–55)
AST: 21 U/L (ref 5–34)
BUN: 58 mg/dL — ABNORMAL HIGH (ref 7.0–26.0)
CO2: 21 mEq/L — ABNORMAL LOW (ref 22–29)
Calcium: 9.5 mg/dL (ref 8.4–10.4)
Creatinine: 2.3 mg/dL — ABNORMAL HIGH (ref 0.7–1.3)
Total Bilirubin: 0.5 mg/dL (ref 0.20–1.20)

## 2011-12-03 LAB — CBC WITH DIFFERENTIAL/PLATELET
BASO%: 0.2 % (ref 0.0–2.0)
Basophils Absolute: 0 10*3/uL (ref 0.0–0.1)
EOS%: 6 % (ref 0.0–7.0)
HGB: 11.7 g/dL — ABNORMAL LOW (ref 13.0–17.1)
MCH: 30.9 pg (ref 27.2–33.4)
MCHC: 32.3 g/dL (ref 32.0–36.0)
MCV: 95.5 fL (ref 79.3–98.0)
MONO%: 7.5 % (ref 0.0–14.0)
RDW: 14.5 % (ref 11.0–14.6)

## 2011-12-03 MED ORDER — SODIUM CHLORIDE 0.9 % IV SOLN: Freq: Once | INTRAVENOUS | Status: DC

## 2011-12-03 MED ORDER — LIDOCAINE HCL 2 % EX GEL
Freq: Once | CUTANEOUS | Status: AC
  Administered 2011-12-03: 20 via URETHRAL
  Filled 2011-12-03: qty 20

## 2011-12-03 MED ORDER — MITOMYCIN CHEMO FOR BLADDER INSTILLATION 20 MG
20.0000 mg | Freq: Once | INTRAVENOUS | Status: AC
  Administered 2011-12-03: 20 mg via INTRAVESICAL
  Filled 2011-12-03: qty 40

## 2011-12-03 MED ORDER — ONDANSETRON HCL 8 MG PO TABS
8.0000 mg | ORAL_TABLET | Freq: Once | ORAL | Status: AC
  Administered 2011-12-03: 8 mg via ORAL

## 2011-12-03 NOTE — Patient Instructions (Addendum)
[facility] Discharge Instructions for Patients Receiving Chemotherapy  Today you received the following chemotherapy agents Mitomcycin Please drink plenty of fluids for 7 days. Flush toilet twice with lid down and sit down for urination for 7 days to avoid splashes.   To help prevent nausea and vomiting after your treatment, we encourage you to take your nausea medication as per Dr. Clelia Croft.   If you develop nausea and vomiting that is not controlled by your nausea medication, call the clinic. If it is after clinic hours your family physician or the after hours number for the clinic or go to the Emergency Department.   BELOW ARE SYMPTOMS THAT SHOULD BE REPORTED IMMEDIATELY:  *FEVER GREATER THAN 100.5 F  *CHILLS WITH OR WITHOUT FEVER  NAUSEA AND VOMITING THAT IS NOT CONTROLLED WITH YOUR NAUSEA MEDICATION  *UNUSUAL SHORTNESS OF BREATH  *UNUSUAL BRUISING OR BLEEDING  TENDERNESS IN MOUTH AND THROAT WITH OR WITHOUT PRESENCE OF ULCERS  *URINARY PROBLEMS  *BOWEL PROBLEMS  UNUSUAL RASH Items with * indicate a potential emergency and should be followed up as soon as possible.   Feel free to call the clinic you have any questions or concerns. The clinic phone number is [phone].   I have been informed and understand all the instructions given to me. I know to contact the clinic, my physician, or go to the Emergency Department if any problems should occur. I do not have any questions at this time, but understand that I may call the clinic during office hours   should I have any questions or need assistance in obtaining follow up care.    __________________________________________  _____________  __________ Signature of Patient or Authorized Representative            Date                   Time    __________________________________________ Nurse's Signature

## 2011-12-03 NOTE — Progress Notes (Signed)
#  16 French foley inserted by Daivd Council NT. Pt. Tolerated procedure well. Appr. 150 ml of clear urine emptied.  Intravesicular chemo instilled into bladder by Gladis Riffle RN.  Pt. Tolerated procedure well.  Pt. Aware to turn q x 2hrs if able to tolerated.  HL  Pt. held in chemo x 2 hrs in bladder.  Foley drained with of blue/gray colour urine. No hematuria noted.  Pt. tolerated procedure well. Foley removed.  HL

## 2011-12-10 ENCOUNTER — Ambulatory Visit (HOSPITAL_BASED_OUTPATIENT_CLINIC_OR_DEPARTMENT_OTHER): Payer: Medicare Other

## 2011-12-10 ENCOUNTER — Other Ambulatory Visit (HOSPITAL_BASED_OUTPATIENT_CLINIC_OR_DEPARTMENT_OTHER): Payer: Medicare Other | Admitting: Lab

## 2011-12-10 ENCOUNTER — Telehealth: Payer: Self-pay | Admitting: Oncology

## 2011-12-10 ENCOUNTER — Ambulatory Visit (HOSPITAL_BASED_OUTPATIENT_CLINIC_OR_DEPARTMENT_OTHER): Payer: Medicare Other | Admitting: Oncology

## 2011-12-10 VITALS — BP 130/62 | HR 64 | Temp 96.9°F | Resp 20 | Ht 72.0 in | Wt 143.5 lb

## 2011-12-10 DIAGNOSIS — N289 Disorder of kidney and ureter, unspecified: Secondary | ICD-10-CM

## 2011-12-10 DIAGNOSIS — C679 Malignant neoplasm of bladder, unspecified: Secondary | ICD-10-CM

## 2011-12-10 DIAGNOSIS — Z5111 Encounter for antineoplastic chemotherapy: Secondary | ICD-10-CM

## 2011-12-10 DIAGNOSIS — Z8547 Personal history of malignant neoplasm of testis: Secondary | ICD-10-CM

## 2011-12-10 LAB — CBC WITH DIFFERENTIAL/PLATELET
BASO%: 0.3 % (ref 0.0–2.0)
Basophils Absolute: 0 10*3/uL (ref 0.0–0.1)
HCT: 36.5 % — ABNORMAL LOW (ref 38.4–49.9)
HGB: 12 g/dL — ABNORMAL LOW (ref 13.0–17.1)
LYMPH%: 13.8 % — ABNORMAL LOW (ref 14.0–49.0)
MCHC: 32.9 g/dL (ref 32.0–36.0)
MONO#: 0.7 10*3/uL (ref 0.1–0.9)
NEUT%: 72.9 % (ref 39.0–75.0)
Platelets: 135 10*3/uL — ABNORMAL LOW (ref 140–400)
WBC: 9.7 10*3/uL (ref 4.0–10.3)
lymph#: 1.3 10*3/uL (ref 0.9–3.3)

## 2011-12-10 MED ORDER — LIDOCAINE HCL 2 % EX GEL
Freq: Once | CUTANEOUS | Status: AC
Start: 2011-12-10 — End: 2011-12-10
  Administered 2011-12-10: 10:00:00 via URETHRAL
  Filled 2011-12-10: qty 20

## 2011-12-10 MED ORDER — ONDANSETRON HCL 8 MG PO TABS
8.0000 mg | ORAL_TABLET | Freq: Once | ORAL | Status: AC
  Administered 2011-12-10: 8 mg via ORAL

## 2011-12-10 MED ORDER — MITOMYCIN CHEMO FOR BLADDER INSTILLATION 20 MG
20.0000 mg | Freq: Once | INTRAVENOUS | Status: AC
  Administered 2011-12-10: 20 mg via INTRAVESICAL
  Filled 2011-12-10: qty 40

## 2011-12-10 MED ORDER — SODIUM CHLORIDE 0.9 % IV SOLN: Freq: Once | INTRAVENOUS | Status: DC

## 2011-12-10 NOTE — Progress Notes (Signed)
1020 Lidicaine applied to tip of penis.1035 16 Fr. Foley Catherter inserted. Pt tolerated procedure with mild discomfort.

## 2011-12-10 NOTE — Progress Notes (Signed)
Hematology and Oncology Follow Up Visit  [name] [id number] 28-Jan-1935 77 y.o. 12/10/2011 8:42 AM Neldon Labella, MDMiller, Misty Stanley, MD    Principle Diagnosis: 77 year old with Superficial bladder tumor.  Prior Therapy: BCG treatment x 2 in the past. Treatment was completed in June 2013.  Current therapy: Weekly intra vesicular Mitomycin-C. Today is week 6 of 6.  Interim History:  [name] is seen for routine follow-up prior to his Mitomycin today. Reports that he is tolerating this well. Denies and bladder pain or spasms. He has not noticed any hematuria or any other bleeding. No dysuria. Denies chest pain, shortness of breath, abdominal pain, nausea, or vomiting.  He Kidney function  remains stable. His performance status remains stable   Medications: I have reviewed the patient's current medications. Current outpatient prescriptions:allopurinol (ZYLOPRIM) 100 MG tablet, Take 100 mg by mouth daily. , Disp: , Rfl: ;  aspirin 81 MG tablet, Take 81 mg by mouth daily., Disp: , Rfl: ;  furosemide (LASIX) 40 MG tablet, Take 40 mg by mouth every morning. , Disp: , Rfl: ;  hydrALAZINE (APRESOLINE) 25 MG tablet, Take 25 mg by mouth 3 (three) times daily. , Disp: , Rfl:  insulin aspart (NOVOLOG) 100 UNIT/ML injection, Inject 3 Units into the skin 3 (three) times daily before meals.  , Disp: , Rfl: ;  Insulin Pen Needle 31G X 8 MM MISC, Inject 1 Device into the skin 4 (four) times daily., Disp: 100 each, Rfl: 11;  metoprolol succinate (TOPROL-XL) 100 MG 24 hr tablet, TAKE 1 TABLET BY MOUTH DAILY, Disp: 30 tablet, Rfl: 2;  simvastatin (ZOCOR) 20 MG tablet, Take 20 mg by mouth every morning. , Disp: , Rfl:  vitamin B-12 (CYANOCOBALAMIN) 500 MCG tablet, Take 500 mcg by mouth daily.  , Disp: , Rfl: ;  insulin glargine (LANTUS) 100 UNIT/ML injection, Inject 10 Units into the skin every morning.  , Disp: , Rfl:   Allergies: No Known Allergies  Past Medical History, Surgical history, Social history,  and Family History were reviewed and updated.  Review of Systems: Constitutional:  Negative for fever, chills, night sweats, anorexia, weight loss, pain. Cardiovascular: no chest pain or dyspnea on exertion Respiratory: no cough, shortness of breath, or wheezing Neurological: no TIA or stroke symptoms Dermatological: negative ENT: negative Skin: Negative. Gastrointestinal: no abdominal pain, change in bowel habits, or black or bloody stools Genito-Urinary: no dysuria, trouble voiding, or hematuria Hematological and Lymphatic: negative Breast: negative for breast lumps Musculoskeletal: negative Remaining ROS negative. Physical Exam: Blood pressure 130/62, pulse 64, temperature 96.9 F (36.1 C), temperature source Oral, resp. rate 20, height 6' (1.829 m), weight 143 lb 8 oz (65.091 kg). ECOG:  General appearance: alert, cooperative and no distress Head: Normocephalic, without obvious abnormality, atraumatic Neck: no adenopathy, no carotid bruit, no JVD, supple, symmetrical, trachea midline and thyroid not enlarged, symmetric, no tenderness/mass/nodules Lymph nodes: Cervical, supraclavicular, and axillary nodes normal. Heart:regular rate and rhythm, S1, S2 normal, no murmur, click, rub or gallop Lung:chest clear, no wheezing, rales, normal symmetric air entry, no tachypnea, retractions or cyanosis Abdomen: soft, non-tender, without masses or organomegaly EXT:no erythema, induration, or nodules   Lab Results: Lab Results  Component Value Date   WBC 9.7 12/10/2011   HGB 12.0* 12/10/2011   HCT 36.5* 12/10/2011   MCV 95.1 12/10/2011   PLT 135* 12/10/2011     Chemistry      Component Value Date/Time   NA 145 12/03/2011 0919   NA 140 02/13/2011  1050   K 4.3 12/03/2011 0919   K 4.9 02/13/2011 1050   CL 112* 12/03/2011 0919   CL 103 02/13/2011 1050   CO2 21* 12/03/2011 0919   CO2 29 02/13/2011 1050   BUN 58.0* 12/03/2011 0919   BUN 83* 02/13/2011 1050   CREATININE 2.3* 12/03/2011  0919   CREATININE 2.42* 02/13/2011 1050      Component Value Date/Time   CALCIUM 9.5 12/03/2011 0919   CALCIUM 10.7* 02/13/2011 1050   ALKPHOS 85 12/03/2011 0919   ALKPHOS 95 08/09/2010 1635   AST 21 12/03/2011 0919   AST 24 08/09/2010 1635   ALT 23 12/03/2011 0919   ALT 37 08/09/2010 1635   BILITOT 0.50 12/03/2011 0919   BILITOT 0.2* 08/09/2010 1635     Impression and Plan: This is a 77 year old gentleman with the following issues: 1. Superficial bladder tumor: On weekly intra vesicular Mitomycin-C. Tolerating this well. Recommend that he continue Mitomycin without dose modification. Plan is for a total of 6 weekly treatments. He will be done today.  I will refer him back to Dr. Retta Diones for continued follow up and possible cytoscopy.   2. History of testicular cancer: S/P XRT in 2002. No signs of recurrence.  3. HTN: On amlodipine and Toprol, hydralazine, and Lasix per PCP.  4. Hyperlipidemia: On simvastatin per PCP.  5. Diabetes: On Humalog and Lantus insulin per PCP.  6. Renal insufficiency, due to DM and HTN. Baseline creatinine is ~2.5. This has remained stable.  7. Follow-up: in 3 month.      [name] 10/15/20138:42 AM

## 2011-12-10 NOTE — Patient Instructions (Signed)
[facility] Discharge Instructions for Patients Receiving Chemotherapy  Today you received the following chemotherapy agents Mitomycin To help prevent nausea and vomiting after your treatment, we encourage you to take your nausea medication as directed.   If you develop nausea and vomiting that is not controlled by your nausea medication, call the clinic. If it is after clinic hours your family physician or the after hours number for the clinic or go to the Emergency Department.   BELOW ARE SYMPTOMS THAT SHOULD BE REPORTED IMMEDIATELY:  *FEVER GREATER THAN 100.5 F  *CHILLS WITH OR WITHOUT FEVER  NAUSEA AND VOMITING THAT IS NOT CONTROLLED WITH YOUR NAUSEA MEDICATION  *UNUSUAL SHORTNESS OF BREATH  *UNUSUAL BRUISING OR BLEEDING  TENDERNESS IN MOUTH AND THROAT WITH OR WITHOUT PRESENCE OF ULCERS  *URINARY PROBLEMS  *BOWEL PROBLEMS  UNUSUAL RASH Items with * indicate a potential emergency and should be followed up as soon as possible.  One of the nurses will contact you 24 hours after your treatment. Please let the nurse know about any problems that you may have experienced. Feel free to call the clinic you have any questions or concerns. The clinic phone number is [phone].   I have been informed and understand all the instructions given to me. I know to contact the clinic, my physician, or go to the Emergency Department if any problems should occur. I do not have any questions at this time, but understand that I may call the clinic during office hours   should I have any questions or need assistance in obtaining follow up care.    __________________________________________  _____________  __________ Signature of Patient or Authorized Representative            Date                   Time    __________________________________________ Nurse's Signature

## 2011-12-10 NOTE — Telephone Encounter (Signed)
Printed and gv pt Jan 2014 appt.

## 2011-12-12 ENCOUNTER — Other Ambulatory Visit: Payer: Self-pay | Admitting: Certified Registered Nurse Anesthetist

## 2011-12-17 ENCOUNTER — Ambulatory Visit: Payer: Medicare Other

## 2011-12-17 ENCOUNTER — Other Ambulatory Visit: Payer: Medicare Other | Admitting: Lab

## 2011-12-24 ENCOUNTER — Other Ambulatory Visit: Payer: Medicare Other | Admitting: Lab

## 2011-12-24 ENCOUNTER — Ambulatory Visit: Payer: Medicare Other

## 2011-12-31 ENCOUNTER — Other Ambulatory Visit: Payer: Medicare Other | Admitting: Lab

## 2012-01-15 ENCOUNTER — Other Ambulatory Visit: Payer: Self-pay | Admitting: Endocrinology

## 2012-01-27 ENCOUNTER — Other Ambulatory Visit: Payer: Self-pay | Admitting: Endocrinology

## 2012-02-27 ENCOUNTER — Other Ambulatory Visit: Payer: Self-pay | Admitting: Endocrinology

## 2012-02-28 ENCOUNTER — Telehealth: Payer: Self-pay | Admitting: Oncology

## 2012-02-28 NOTE — Telephone Encounter (Signed)
pt called and stated that Dr. Clelia Croft advised him that if he felt he did not need to come to cancel appt...Marland KitchenMarland KitchenDone

## 2012-03-03 ENCOUNTER — Other Ambulatory Visit: Payer: Self-pay

## 2012-03-03 ENCOUNTER — Other Ambulatory Visit: Payer: Self-pay | Admitting: Endocrinology

## 2012-03-10 ENCOUNTER — Other Ambulatory Visit: Payer: Medicare Other | Admitting: Lab

## 2012-03-10 ENCOUNTER — Ambulatory Visit: Payer: Medicare Other | Admitting: Oncology

## 2012-03-23 ENCOUNTER — Other Ambulatory Visit: Payer: Self-pay | Admitting: Urology

## 2012-03-26 ENCOUNTER — Encounter (HOSPITAL_COMMUNITY): Payer: Self-pay

## 2012-04-01 ENCOUNTER — Ambulatory Visit (HOSPITAL_COMMUNITY)
Admission: RE | Admit: 2012-04-01 | Discharge: 2012-04-01 | Disposition: A | Discharge status: Home / Self Care | Payer: Medicare Other | Source: Ambulatory Visit | Attending: Urology | Admitting: Urology

## 2012-04-01 ENCOUNTER — Encounter (HOSPITAL_COMMUNITY)
Admission: RE | Admit: 2012-04-01 | Discharge: 2012-04-01 | Disposition: A | Discharge status: Home / Self Care | Payer: Medicare Other | Source: Ambulatory Visit | Attending: Urology | Admitting: Urology

## 2012-04-01 DIAGNOSIS — Z01818 Encounter for other preprocedural examination: Secondary | ICD-10-CM | POA: Insufficient documentation

## 2012-04-01 DIAGNOSIS — Z01812 Encounter for preprocedural laboratory examination: Secondary | ICD-10-CM | POA: Insufficient documentation

## 2012-04-01 LAB — BASIC METABOLIC PANEL
CO2: 25 mEq/L (ref 19–32)
GFR calc non Af Amer: 29 mL/min — ABNORMAL LOW (ref >90)
Glucose, Bld: 207 mg/dL — ABNORMAL HIGH (ref 70–99)
Potassium: 4.9 mEq/L (ref 3.5–5.1)
Sodium: 140 mEq/L (ref 135–145)

## 2012-04-01 LAB — CBC
Hemoglobin: 11.4 g/dL — ABNORMAL LOW (ref 13.0–17.0)
RBC: 3.58 MIL/uL — ABNORMAL LOW (ref 4.22–5.81)
WBC: 10.4 10*3/uL (ref 4.0–10.5)

## 2012-04-01 LAB — SURGICAL PCR SCREEN: Staphylococcus aureus: NEGATIVE

## 2012-04-01 NOTE — Progress Notes (Signed)
04-01-12 1445 labs viewable in Epic- note CBC/ BMP results. W. Kennon Portela

## 2012-04-01 NOTE — Patient Instructions (Addendum)
[address] Cornelio Parkerson Kuang  04/01/2012   Your procedure is scheduled on: 2-10  -2014  Report to [facility] at      0700  AM.  Call this number if you have problems the morning of surgery: [phone]  Or Presurgical Testing [phone](Bellina Tokarczyk)     Do not eat food:After Midnight.  .  Take these medicines the morning of surgery with A SIP OF WATER: Allopurinol. Metoprolol. Amlodipine. Simvastatin. Do not take any Insulin AM of Surgery(no Novolog or Lantus).   Do not wear jewelry, make-up or nail polish.  Do not wear lotions, powders, or perfumes. You may wear deodorant.  Do not shave 12 hours prior to first CHG shower(legs and under arms).(face and neck okay.)  Do not bring valuables to the hospital.  Contacts, dentures or bridgework,body piercing,  may not be worn into surgery.  Leave suitcase in the car. After surgery it may be brought to your room.  For patients admitted to the hospital, checkout time is 11:00 AM the day of discharge.   Patients discharged the day of surgery will not be allowed to drive home. Must have responsible person with you x 24 hours once discharged.  Name and phone number of your driver: [phone]  Special Instructions: CHG Shower Use Special Wash: see special instructions.(avoid face and genitals)   Please read over the following fact sheets that you were given: MRSA Information.  Failure to follow these instructions may result in Cancellation of your surgery.   Patient signature_______________________________________________________

## 2012-04-01 NOTE — Pre-Procedure Instructions (Addendum)
04-01-12 CXR done today. EKG(Dr. Eldridge Dace) received from 12'13-report with chart.

## 2012-04-06 ENCOUNTER — Observation Stay (HOSPITAL_COMMUNITY)
Admission: RE | Admit: 2012-04-06 | Discharge: 2012-04-07 | Disposition: A | Discharge status: Home / Self Care | Payer: Medicare Other | Source: Ambulatory Visit | Attending: Urology | Admitting: Urology

## 2012-04-06 ENCOUNTER — Ambulatory Visit (HOSPITAL_COMMUNITY): Payer: Medicare Other | Admitting: Anesthesiology

## 2012-04-06 ENCOUNTER — Encounter (HOSPITAL_COMMUNITY)
Admission: RE | Disposition: A | Discharge status: Home / Self Care | Payer: Self-pay | Source: Ambulatory Visit | Attending: Urology

## 2012-04-06 ENCOUNTER — Encounter (HOSPITAL_COMMUNITY): Payer: Self-pay | Admitting: *Deleted

## 2012-04-06 ENCOUNTER — Encounter (HOSPITAL_COMMUNITY): Payer: Self-pay | Admitting: Anesthesiology

## 2012-04-06 DIAGNOSIS — C675 Malignant neoplasm of bladder neck: Principal | ICD-10-CM | POA: Insufficient documentation

## 2012-04-06 DIAGNOSIS — Z7982 Long term (current) use of aspirin: Secondary | ICD-10-CM | POA: Insufficient documentation

## 2012-04-06 DIAGNOSIS — N189 Chronic kidney disease, unspecified: Secondary | ICD-10-CM | POA: Insufficient documentation

## 2012-04-06 DIAGNOSIS — E119 Type 2 diabetes mellitus without complications: Secondary | ICD-10-CM | POA: Insufficient documentation

## 2012-04-06 DIAGNOSIS — Z8547 Personal history of malignant neoplasm of testis: Secondary | ICD-10-CM | POA: Insufficient documentation

## 2012-04-06 DIAGNOSIS — N329 Bladder disorder, unspecified: Secondary | ICD-10-CM | POA: Insufficient documentation

## 2012-04-06 DIAGNOSIS — I129 Hypertensive chronic kidney disease with stage 1 through stage 4 chronic kidney disease, or unspecified chronic kidney disease: Secondary | ICD-10-CM | POA: Insufficient documentation

## 2012-04-06 DIAGNOSIS — C670 Malignant neoplasm of trigone of bladder: Secondary | ICD-10-CM | POA: Insufficient documentation

## 2012-04-06 DIAGNOSIS — Z79899 Other long term (current) drug therapy: Secondary | ICD-10-CM | POA: Insufficient documentation

## 2012-04-06 DIAGNOSIS — Z794 Long term (current) use of insulin: Secondary | ICD-10-CM | POA: Insufficient documentation

## 2012-04-06 HISTORY — PX: CYSTOSCOPY W/ RETROGRADES: SHX1426

## 2012-04-06 HISTORY — PX: TRANSURETHRAL RESECTION OF BLADDER TUMOR: SHX2575

## 2012-04-06 LAB — GLUCOSE, CAPILLARY
Glucose-Capillary: 101 mg/dL — ABNORMAL HIGH (ref 70–99)
Glucose-Capillary: 119 mg/dL — ABNORMAL HIGH (ref 70–99)

## 2012-04-06 SURGERY — CYSTOSCOPY, WITH RETROGRADE PYELOGRAM
Anesthesia: Spinal | Site: Ureter | Wound class: Clean Contaminated

## 2012-04-06 MED ORDER — OXYCODONE HCL 5 MG/5ML PO SOLN
5.0000 mg | Freq: Once | ORAL | Status: DC | PRN
  Filled 2012-04-06: qty 5

## 2012-04-06 MED ORDER — STERILE WATER FOR IRRIGATION IR SOLN
Status: DC | PRN
  Administered 2012-04-06: 500 mL

## 2012-04-06 MED ORDER — LACTATED RINGERS IV SOLN
INTRAVENOUS | Status: DC
  Administered 2012-04-06: 11:00:00 via INTRAVENOUS

## 2012-04-06 MED ORDER — IOHEXOL 300 MG/ML  SOLN
INTRAMUSCULAR | Status: DC | PRN
  Administered 2012-04-06: 10 mL

## 2012-04-06 MED ORDER — INDIGOTINDISULFONATE SODIUM 8 MG/ML IJ SOLN
INTRAMUSCULAR | Status: AC
  Filled 2012-04-06: qty 5

## 2012-04-06 MED ORDER — HYDROMORPHONE HCL PF 1 MG/ML IJ SOLN
INTRAMUSCULAR | Status: AC
  Filled 2012-04-06: qty 1

## 2012-04-06 MED ORDER — CIPROFLOXACIN IN D5W 400 MG/200ML IV SOLN
400.0000 mg | INTRAVENOUS | Status: AC
  Administered 2012-04-06: 400 mg via INTRAVENOUS

## 2012-04-06 MED ORDER — AMLODIPINE BESYLATE 10 MG PO TABS
10.0000 mg | ORAL_TABLET | Freq: Every day | ORAL | Status: DC
  Administered 2012-04-07: 10 mg via ORAL
  Filled 2012-04-06: qty 1

## 2012-04-06 MED ORDER — ACETAMINOPHEN 10 MG/ML IV SOLN
INTRAVENOUS | Status: DC | PRN
  Administered 2012-04-06: 1000 mg via INTRAVENOUS

## 2012-04-06 MED ORDER — ACETAMINOPHEN 10 MG/ML IV SOLN
INTRAVENOUS | Status: AC
  Filled 2012-04-06: qty 100

## 2012-04-06 MED ORDER — BELLADONNA ALKALOIDS-OPIUM 16.2-60 MG RE SUPP
RECTAL | Status: AC
  Filled 2012-04-06: qty 1

## 2012-04-06 MED ORDER — SIMVASTATIN 20 MG PO TABS
20.0000 mg | ORAL_TABLET | Freq: Every day | ORAL | Status: DC
  Filled 2012-04-06: qty 1

## 2012-04-06 MED ORDER — ALLOPURINOL 100 MG PO TABS
100.0000 mg | ORAL_TABLET | Freq: Every day | ORAL | Status: DC
  Administered 2012-04-07: 100 mg via ORAL
  Filled 2012-04-06: qty 1

## 2012-04-06 MED ORDER — IOHEXOL 300 MG/ML  SOLN
INTRAMUSCULAR | Status: AC
  Filled 2012-04-06: qty 1

## 2012-04-06 MED ORDER — FUROSEMIDE 20 MG PO TABS
20.0000 mg | ORAL_TABLET | Freq: Every day | ORAL | Status: DC
  Administered 2012-04-06 – 2012-04-07 (×2): 20 mg via ORAL
  Filled 2012-04-06 (×2): qty 1

## 2012-04-06 MED ORDER — BELLADONNA ALKALOIDS-OPIUM 16.2-60 MG RE SUPP
RECTAL | Status: DC | PRN
  Administered 2012-04-06: 1 via RECTAL

## 2012-04-06 MED ORDER — BELLADONNA ALKALOIDS-OPIUM 16.2-60 MG RE SUPP: 1.0000 | Freq: Four times a day (QID) | RECTAL | Status: DC | PRN

## 2012-04-06 MED ORDER — MEPERIDINE HCL 50 MG/ML IJ SOLN: 6.2500 mg | INTRAMUSCULAR | Status: DC | PRN

## 2012-04-06 MED ORDER — PROPOFOL 10 MG/ML IV BOLUS
INTRAVENOUS | Status: DC | PRN
  Administered 2012-04-06: 180 mg via INTRAVENOUS

## 2012-04-06 MED ORDER — METOPROLOL SUCCINATE ER 100 MG PO TB24
100.0000 mg | ORAL_TABLET | Freq: Every day | ORAL | Status: DC
  Administered 2012-04-07: 100 mg via ORAL
  Filled 2012-04-06: qty 1

## 2012-04-06 MED ORDER — MITOMYCIN CHEMO FOR BLADDER INSTILLATION 40 MG
40.0000 mg | Freq: Once | INTRAVENOUS | Status: AC
  Administered 2012-04-06: 40 mg via INTRAVESICAL
  Filled 2012-04-06: qty 40

## 2012-04-06 MED ORDER — HYDRALAZINE HCL 25 MG PO TABS
25.0000 mg | ORAL_TABLET | Freq: Three times a day (TID) | ORAL | Status: DC
  Administered 2012-04-06 – 2012-04-07 (×3): 25 mg via ORAL
  Filled 2012-04-06 (×5): qty 1

## 2012-04-06 MED ORDER — SODIUM CHLORIDE 0.45 % IV SOLN
INTRAVENOUS | Status: DC
  Administered 2012-04-06: 20:00:00 via INTRAVENOUS

## 2012-04-06 MED ORDER — LIDOCAINE HCL (CARDIAC) 20 MG/ML IV SOLN
INTRAVENOUS | Status: DC | PRN
  Administered 2012-04-06: 50 mg via INTRAVENOUS

## 2012-04-06 MED ORDER — CIPROFLOXACIN HCL 250 MG PO TABS
250.0000 mg | ORAL_TABLET | Freq: Two times a day (BID) | ORAL | Status: AC
  Administered 2012-04-06: 250 mg via ORAL
  Filled 2012-04-06: qty 1

## 2012-04-06 MED ORDER — HYDROMORPHONE HCL PF 1 MG/ML IJ SOLN
0.2500 mg | INTRAMUSCULAR | Status: DC | PRN
  Administered 2012-04-06 (×3): 0.5 mg via INTRAVENOUS

## 2012-04-06 MED ORDER — CIPROFLOXACIN IN D5W 400 MG/200ML IV SOLN
INTRAVENOUS | Status: AC
  Filled 2012-04-06: qty 200

## 2012-04-06 MED ORDER — OXYCODONE HCL 5 MG PO TABS: 5.0000 mg | ORAL_TABLET | Freq: Once | ORAL | Status: DC | PRN

## 2012-04-06 MED ORDER — LACTATED RINGERS IV SOLN
INTRAVENOUS | Status: DC | PRN
  Administered 2012-04-06: 09:00:00 via INTRAVENOUS

## 2012-04-06 MED ORDER — SODIUM CHLORIDE 0.9 % IR SOLN
Status: DC | PRN
  Administered 2012-04-06: 9000 mL

## 2012-04-06 MED ORDER — INSULIN GLARGINE 100 UNIT/ML ~~LOC~~ SOLN
10.0000 [IU] | Freq: Every day | SUBCUTANEOUS | Status: DC
  Administered 2012-04-07: 10 [IU] via SUBCUTANEOUS

## 2012-04-06 MED ORDER — FENTANYL CITRATE 0.05 MG/ML IJ SOLN
INTRAMUSCULAR | Status: DC | PRN
  Administered 2012-04-06 (×2): 50 ug via INTRAVENOUS

## 2012-04-06 MED ORDER — INSULIN PEN NEEDLE 31G X 8 MM MISC: 1.0000 | Freq: Four times a day (QID) | Status: DC

## 2012-04-06 MED ORDER — ONDANSETRON HCL 4 MG/2ML IJ SOLN
INTRAMUSCULAR | Status: DC | PRN
  Administered 2012-04-06: 4 mg via INTRAVENOUS

## 2012-04-06 MED ORDER — PROMETHAZINE HCL 25 MG/ML IJ SOLN
6.2500 mg | INTRAMUSCULAR | Status: DC | PRN
Start: 2012-04-06 — End: 2012-04-06

## 2012-04-06 MED ORDER — ACETAMINOPHEN 10 MG/ML IV SOLN: 1000.0000 mg | Freq: Once | INTRAVENOUS | Status: DC | PRN

## 2012-04-06 MED ORDER — INSULIN ASPART 100 UNIT/ML ~~LOC~~ SOLN
3.0000 [IU] | Freq: Three times a day (TID) | SUBCUTANEOUS | Status: DC
  Administered 2012-04-07: 3 [IU] via SUBCUTANEOUS

## 2012-04-06 SURGICAL SUPPLY — 25 items
ADAPTER CATH URET PLST 4-6FR (CATHETERS) ×3 IMPLANT
BAG URINE DRAINAGE (UROLOGICAL SUPPLIES) IMPLANT
BAG URO CATCHER STRL LF (DRAPE) ×3 IMPLANT
CATH FOLEY 2WAY SLVR  5CC 20FR (CATHETERS) ×1
CATH FOLEY 2WAY SLVR 5CC 20FR (CATHETERS) ×2 IMPLANT
CATH URET 5FR 28IN CONE TIP (BALLOONS) ×1
CATH URET 5FR 70CM CONE TIP (BALLOONS) ×2 IMPLANT
CLOTH BEACON ORANGE TIMEOUT ST (SAFETY) ×3 IMPLANT
DRAPE CAMERA CLOSED 9X96 (DRAPES) ×3 IMPLANT
ELECT HF RESECT BIPO 24F 45 ND (CUTTING LOOP) ×3 IMPLANT
ELECT REM PT RETURN 9FT ADLT (ELECTROSURGICAL) ×3
ELECTRODE REM PT RTRN 9FT ADLT (ELECTROSURGICAL) ×2 IMPLANT
EVACUATOR MICROVAS BLADDER (UROLOGICAL SUPPLIES) IMPLANT
GLOVE BIOGEL M 8.0 STRL (GLOVE) ×3 IMPLANT
GOWN PREVENTION PLUS XLARGE (GOWN DISPOSABLE) ×3 IMPLANT
GOWN STRL REIN XL XLG (GOWN DISPOSABLE) ×3 IMPLANT
GUIDEWIRE STR DUAL SENSOR (WIRE) ×3 IMPLANT
KIT ASPIRATION TUBING (SET/KITS/TRAYS/PACK) IMPLANT
LOOPS RESECTOSCOPE DISP (ELECTROSURGICAL) ×3 IMPLANT
MANIFOLD NEPTUNE II (INSTRUMENTS) ×3 IMPLANT
PACK CYSTO (CUSTOM PROCEDURE TRAY) ×3 IMPLANT
SYRINGE 10CC LL (SYRINGE) ×3 IMPLANT
SYRINGE IRR TOOMEY STRL 70CC (SYRINGE) ×3 IMPLANT
TUBING CONNECTING 10 (TUBING) ×3 IMPLANT
WIRE COONS/BENSON .038X145CM (WIRE) IMPLANT

## 2012-04-06 NOTE — Anesthesia Preprocedure Evaluation (Addendum)
Anesthesia Evaluation  Patient identified by MRN, date of birth, ID band Patient awake    Reviewed: Allergy & Precautions, H&P , NPO status , Patient's Chart, lab work & pertinent test results, reviewed documented beta blocker date and time   Airway Mallampati: II TM Distance: >3 FB Neck ROM: Full    Dental  (+) Dental Advisory Given, Teeth Intact, Chipped and Poor Dentition   Pulmonary neg pulmonary ROS,  breath sounds clear to auscultation  Pulmonary exam normal       Cardiovascular hypertension, Pt. on medications and Pt. on home beta blockers + Peripheral Vascular Disease + Valvular Problems/Murmurs Rhythm:Regular Rate:Normal  Denies cardiac symptoms   Neuro/Psych negative neurological ROS  negative psych ROS   GI/Hepatic negative GI ROS, Neg liver ROS,   Endo/Other  diabetes, Type 1, Insulin Dependent  Renal/GU CRFRenal diseaseElevated Cr 2.42   Bladder cancer Testicular cancer in childhood    Musculoskeletal negative musculoskeletal ROS (+)   Abdominal   Peds negative pediatric ROS (+)  Hematology negative hematology ROS (+)   Anesthesia Other Findings   Reproductive/Obstetrics                          Anesthesia Physical  Anesthesia Plan  ASA: III  Anesthesia Plan: General   Post-op Pain Management:    Induction: Intravenous  Airway Management Planned: LMA  Additional Equipment:   Intra-op Plan:   Post-operative Plan: Extubation in OR  Informed Consent: I have reviewed the patients History and Physical, chart, labs and discussed the procedure including the risks, benefits and alternatives for the proposed anesthesia with the patient or authorized representative who has indicated his/her understanding and acceptance.   Dental advisory given  Plan Discussed with: CRNA  Anesthesia Plan Comments:        Anesthesia Quick Evaluation

## 2012-04-06 NOTE — H&P (Signed)
Urology History and Physical Exam  CC: bladder cancer  HPI: 78 year old male with a history significant for bladder cancer. He has been followed in our office for some time, and has undergone both BCG and mitomycin therapy. His history is outlined below:  He originally presented to Dr. Aldean Ast on 02/01/2010. At that time he had 2 episodes of gross, painless hematuria. He had negative cultures prior to presentation. Evaluation included cystoscopy and CT abdomen and pelvis. CT revealed no evidence of renal disease, and no abnormalities within the bladder. Cystoscopy revealed a 1.5 cm papillary tumor at the base of the bladder, with a nearby 3 cm sessile tumor. He underwent TURBT on 02/27/2010. He tolerated the procedure well. Pathology revealed invasive high-grade urothelial carcinoma with some muscular involvement. The TURBT was performed on 04/24/2010. The bladder base revealed high-grade papillary urothelial carcinoma with adjacent carcinoma in situ, with no evidence of tumor involvement in the muscle. There was lamina propria invasion/involvement, however. The right trigone revealed invasive high-grade urothelial carcinoma with adjacent urothelial carcinoma in situ. There was slight muscular involvement.  He was seen by both Dr. Clelia Croft and Dr. Dayton Scrape. The patient was not felt to be a candidate for chemotherapy/radiotherapy. He did have abdominal/retroperitoneal radiation in the mid 60s, for testicular cancer. He underwent orchiectomy with radiation and retroperitoneal lymph node dissection. He is cancer free regarding testicular cancer.  He was last seen by Dr. Aldean Ast in April, 2012. At that time he was to have been scheduled for BCG. Unfortunately, he had an orthopedic injury, requiring surgery and rehabilitation. I first saw him on 10/04/2010 and strongly recommended intravesical BCG treatments. 6 induction treatments were completed 11/07/2010.  In November, 2012 he underwent surveillance  cystoscopy. This revealed erythema in his trigonal region. Cytologies were positive. He underwent anesthetic cystoscopy and biopsy on 02/15/2011. This revealed carcinoma in situ, with no evidence of muscular involvement. Muscle was present in the specimen.  He underwent repeat BCG induction was was completed in February, 2013. He tolerated that well.  Cystoscopy in June, 2013 was essentially negative the washings were positive for urothelial carcinoma.  He subsequently underwent 6 intravesical treatments of mitomycin which began early September, [facility]. He tolerated those well. He was recently seen in our office for followup cystoscopy. This revealed a 5 mm right posterior bladder neck lesion. Bladder washings sent for cytology were significant for urothelial carcinoma. He presents at this time for anesthetic cystoscopy, retrograde ureteropyelograms and resection of bladder tumor.  PMH: Past Medical History  Diagnosis Date  . Hypertension   . Heart murmur   . Gout   . Chronic kidney disease     RENAL INSUFFICIENCY  . Diabetes mellitus   . Bladder cancer   . Testicular cancer     1965    PSH: Past Surgical History  Procedure Laterality Date  . Orchiectomy  1965    right  . Turbt    . Fx femur  2006  . Cystoscopy w/ retrogrades  02/15/2011    Procedure: CYSTOSCOPY WITH RETROGRADE PYELOGRAM;  Surgeon: Marcine Matar, MD;  Location: [facility];  Service: Urology;  Laterality: Left;  Cystoscopy, Transurethral Resection of Bladder Tumor, Left Retrograde Pyelograms    . Transurethral resection of bladder tumor  02/15/2011    Procedure: TRANSURETHRAL RESECTION OF BLADDER TUMOR (TURBT);  Surgeon: Marcine Matar, MD;  Location: [facility];  Service: Urology;  Laterality: N/A;    Allergies: No Known Allergies  Medications: Prescriptions  prior to admission  Medication Sig Dispense Refill  . allopurinol (ZYLOPRIM) 100 MG tablet Take 100 mg by  mouth daily.       Marland Kitchen amLODipine (NORVASC) 10 MG tablet Take 10 mg by mouth daily.      Marland Kitchen aspirin 81 MG tablet Take 81 mg by mouth every morning.       . furosemide (LASIX) 40 MG tablet Take 20 mg by mouth every morning.       . hydrALAZINE (APRESOLINE) 25 MG tablet Take 25 mg by mouth 3 (three) times daily.       . insulin aspart (NOVOLOG) 100 UNIT/ML injection Inject 3 Units into the skin 3 (three) times daily before meals.        . Insulin Pen Needle 31G X 8 MM MISC Inject 1 Device into the skin 4 (four) times daily.  100 each  11  . metoprolol succinate (TOPROL-XL) 100 MG 24 hr tablet TAKE 1 TABLET BY MOUTH DAILY  30 tablet  0  . simvastatin (ZOCOR) 20 MG tablet Take 20 mg by mouth every morning.       . vitamin B-12 (CYANOCOBALAMIN) 500 MCG tablet Take 500 mcg by mouth daily.        . insulin glargine (LANTUS) 100 UNIT/ML injection Inject 10 Units into the skin every morning.           Social History: History   Social History  . Marital Status: Married    Spouse Name: N/A    Number of Children: N/A  . Years of Education: N/A   Occupational History  .      retired   Social History Main Topics  . Smoking status: Former Games developer  . Smokeless tobacco: Not on file  . Alcohol Use: No  . Drug Use: No  . Sexually Active: Not on file   Other Topics Concern  . Not on file   Social History Narrative  . No narrative on file    Family History: Family History  Problem Relation Age of Onset  . Diabetes Father   . Diabetes Sister     Review of Systems: Essentially negative   Physical Exam: @VITALS2 @ General: No acute distress.  Awake. Head:  Normocephalic.  Atraumatic. ENT:  EOMI.  Mucous membranes moist Neck:  Supple.  No lymphadenopathy. CV:  S1 present. S2 present. Regular rate. Pulmonary: Equal effort bilaterally.  Clear to auscultation bilaterally. Abdomen: Soft.  non tender to palpation. Skin:  Normal turgor.  No visible rash. Extremity: No gross deformity of  bilateral upper extremities.  No gross deformity of    bilateral lower extremities. Neurologic: Alert. Appropriate mood.  Penis:  circumcised.  No lesions. Urethra:   Orthotopic meatus. Scrotum: No lesions.  No ecchymosis.  No erythema. Testicles: Descended bilaterally.  No masses bilaterally.   Studies:  No results found for this basename: HGB, WBC, PLT,  in the last 72 hours  No results found for this basename: NA, K, CL, CO2, BUN, CREATININE, CALCIUM, MAGNESIUM, GFRNONAA, GFRAA,  in the last 72 hours   No results found for this basename: PT, INR, APTT,  in the last 72 hours   No components found with this basename: ABG,     Assessment:   Urothelial carcinoma the bladder, stage TI with associated carcinoma in situ. He is status post resections both on 02/27/2010 and 04/24/2010. There was persistent disease at the time of his last resection. It was recommended at that time, due to the fact  that he was not a candidate for cystectomy, radiotherapy or chemotherapy, that he proceed with BCG. That was put on hold due to orthopedic surgery. He started BCG in August, 2012, and completed his maintenance BCG November 07, 2010. He tolerated his BCG well. He has had no intercurrent issues.  He underwent repeat anesthetic cystoscopy on 02/15/2011. This revealed persistent carcinoma in situ. He then underwent repeat BCG induction with this completed in February, 2013.  In June, 2013, he had positive cytologies. He subsequently underwent mitomycin therapy x6 weeks, terminating on 12/10/2011.  There is one small area of his bladder neck that looks slightly suspicious.   Plan: Anesthetic cystoscopy, bilateral retrograde ureteropyelograms with probable renal washings, TUR of her recurrent bladder tumor. The patient will be admitted for observation

## 2012-04-06 NOTE — Anesthesia Postprocedure Evaluation (Signed)
Anesthesia Post Note  Patient: [name]  Procedure(s) Performed: Procedure(s) (LRB): CYSTOSCOPY WITH RETROGRADE PYELOGRAM (Bilateral) TRANSURETHRAL RESECTION OF BLADDER TUMOR (TURBT) (N/A)  Anesthesia type: General  Patient location: PACU  Post pain: Pain level controlled  Post assessment: Post-op Vital signs reviewed  Last Vitals: BP 126/60  Pulse 56  Temp(Src) 36.3 C (Oral)  Resp 10  SpO2 100%  Post vital signs: Reviewed  Level of consciousness: sedated  Complications: No apparent anesthesia complications

## 2012-04-06 NOTE — Transfer of Care (Signed)
Immediate Anesthesia Transfer of Care Note  Patient: [name]  Procedure(s) Performed: Procedure(s) with comments: CYSTOSCOPY WITH RETROGRADE PYELOGRAM (Bilateral) - CYSTO, TURBT, BILATERAL RETROGRADES AND INSTILLATION Mitomycin C TRANSURETHRAL RESECTION OF BLADDER TUMOR (TURBT) (N/A)  Patient Location: PACU  Anesthesia Type:General  Level of Consciousness: awake, alert  and oriented  Airway & Oxygen Therapy: Patient Spontanous Breathing and Patient connected to face mask oxygen  Post-op Assessment: Report given to PACU RN and Post -op Vital signs reviewed and stable  Post vital signs: Reviewed and stable  Complications: No apparent anesthesia complications

## 2012-04-07 ENCOUNTER — Encounter (HOSPITAL_COMMUNITY): Payer: Self-pay | Admitting: Urology

## 2012-04-07 LAB — GLUCOSE, CAPILLARY

## 2012-04-07 MED ORDER — CIPROFLOXACIN HCL 250 MG PO TABS: 250.0000 mg | ORAL_TABLET | Freq: Two times a day (BID) | ORAL | Status: DC

## 2012-04-07 NOTE — Discharge Summary (Signed)
1 Day Post-Op Subjective: Patient reports that he feels fine. He is in no pain Patient ID: [name] MRN: [id number] DOB/AGE: Mar 12, 1934 78 y.o.  Admit date: 04/06/2012 Discharge date: 04/07/2012  Primary Care Physician:  Neldon Labella, MD  Discharge Diagnoses:   Recurrent urothelial carcinoma of the bladder  Consults: None    Discharge Medications:   Medication List    STOP taking these medications       aspirin 81 MG tablet      TAKE these medications       allopurinol 100 MG tablet  Commonly known as:  ZYLOPRIM  Take 100 mg by mouth daily.     amLODipine 10 MG tablet  Commonly known as:  NORVASC  Take 10 mg by mouth daily.     ciprofloxacin 250 MG tablet  Commonly known as:  CIPRO  Take 1 tablet (250 mg total) by mouth 2 (two) times daily.     furosemide 40 MG tablet  Commonly known as:  LASIX  Take 20 mg by mouth every morning.     hydrALAZINE 25 MG tablet  Commonly known as:  APRESOLINE  Take 25 mg by mouth 3 (three) times daily.     insulin aspart 100 UNIT/ML injection  Commonly known as:  novoLOG  Inject 3 Units into the skin 3 (three) times daily before meals.     insulin glargine 100 UNIT/ML injection  Commonly known as:  LANTUS  Inject 10 Units into the skin every morning.     Insulin Pen Needle 31G X 8 MM Misc  Inject 1 Device into the skin 4 (four) times daily.     metoprolol succinate 100 MG 24 hr tablet  Commonly known as:  TOPROL-XL  TAKE 1 TABLET BY MOUTH DAILY     simvastatin 20 MG tablet  Commonly known as:  ZOCOR  Take 20 mg by mouth every morning.     vitamin B-12 500 MCG tablet  Commonly known as:  CYANOCOBALAMIN  Take 500 mcg by mouth daily.         Significant Diagnostic Studies:  Dg Chest 2 View  04/01/2012  *RADIOLOGY REPORT*  Clinical Data: Preop radiograph  CHEST - 2 VIEW  Comparison: 08/09/2010  Findings: Normal lung volumes.  There are heart size is normal.  No pleural effusions or edema.  Interstitial  coarsening noted compatible with COPD.  No superimposed airspace consolidation. Mild spondylosis identified within the thoracic spine.  IMPRESSION:  1.  Interstitial change of COPD/emphysema. 2.  No acute findings.   Original Report Authenticated By: Signa Kell, M.D.     Brief H and P: For complete details please refer to admission H and P, but in brief the patient was admitted for cystoscopy, bilateral retrograde ureteropyelograms and TURBT. Left retrograde pyelogram was normal, ureteral washing was obtained. Unfortunately, I was unable to cannulate the right ureteral orifice. A biopsy was taken around the right ureteral orifice, and his right bladder neck tumor was resected.  Hospital Course:  The patient did well following surgery, and was discharged on postoperative day #1 following catheter removal  Day of Discharge BP 152/58  Pulse 68  Temp(Src) 98.4 F (36.9 C) (Oral)  Resp 16  Ht 5\' 11"  (1.803 m)  Wt 65.488 kg (144 lb 6 oz)  BMI 20.15 kg/m2  SpO2 95%  Results for orders placed during the hospital encounter of 04/06/12 (from the past 24 hour(s))  GLUCOSE, CAPILLARY     Status: Abnormal   Collection  Time    04/06/12  8:17 AM      Result Value Range   Glucose-Capillary 101 (*) 70 - 99 mg/dL  GLUCOSE, CAPILLARY     Status: Abnormal   Collection Time    04/06/12 11:40 AM      Result Value Range   Glucose-Capillary 119 (*) 70 - 99 mg/dL    Physical Exam: General: Alert and awake oriented x3 not in any acute distress. HEENT: anicteric sclera, pupils reactive to light and accommodation CVS: S1-S2 clear no murmur rubs or gallops Chest: clear to auscultation bilaterally, no wheezing rales or rhonchi Abdomen: soft nontender, nondistended, normal bowel sounds, no organomegaly Extremities: no cyanosis, clubbing or edema noted bilaterally Neuro: Cranial nerves II-XII intact, no focal neurological deficits  Disposition:  Home  Diet:  Carbohydrate limited  Activity:   Gradually increase   Disposition and Follow-up:    He will followup as scheduled  TESTS THAT NEED FOLLOW-UP  Review of pathology  DISCHARGE FOLLOW-UP   Time spent on Discharge:  10 minutes  Signed: Chelsea Aus 04/07/2012, 6:50 AM      Objective: Vital signs in last 24 hours: Temp:  [97.2 F (36.2 C)-98.4 F (36.9 C)] 98.4 F (36.9 C) (02/11 0526) Pulse Rate:  [51-68] 68 (02/11 0526) Resp:  [7-18] 16 (02/11 0526) BP: (124-159)/(54-71) 152/58 mmHg (02/11 0526) SpO2:  [95 %-100 %] 95 % (02/11 0526) Weight:  [65.488 kg (144 lb 6 oz)] 65.488 kg (144 lb 6 oz) (02/10 1300)  Intake/Output from previous day: 02/10 0701 - 02/11 0700 In: 2417.5 [P.O.:960; I.V.:1457.5] Out: 791 [Urine:790; Stool:1] Intake/Output this shift: Total I/O In: 157.5 [I.V.:157.5] Out: 791 [Urine:790; Stool:1]  Physical Exam:  Constitutional: Vital signs reviewed. WD WN in NAD   Eyes: PERRL, No scleral icterus.   Cardiovascular: RRR Pulmonary/Chest: Normal effort Abdominal: Soft. Non-tender, non-distended, bowel sounds are normal, no masses, organomegaly, or guarding present.  Genitourinary: Extremities: No cyanosis or edema   Lab Results: No results found for this basename: HGB, HCT,  in the last 72 hours BMET No results found for this basename: NA, K, CL, CO2, GLUCOSE, BUN, CREATININE, CALCIUM,  in the last 72 hours No results found for this basename: LABPT, INR,  in the last 72 hours No results found for this basename: LABURIN,  in the last 72 hours Results for orders placed during the hospital encounter of 04/01/12  SURGICAL PCR SCREEN     Status: None   Collection Time    04/01/12 10:15 AM      Result Value Range Status   MRSA, PCR NEGATIVE  NEGATIVE Final   Staphylococcus aureus NEGATIVE  NEGATIVE Final   Comment:            The Xpert SA Assay (FDA     approved for NASAL specimens     in patients over 21 years of age),     is one component of     a comprehensive  surveillance     program.  Test performance has     been validated by [employer] for patients greater     than or equal to 77 year old.     It is not intended     to diagnose infection nor to     guide or monitor treatment.    Studies/Results: No results found.  Assessment/Plan:   You'll home today following voiding   LOS: 1 day   Marcine Matar M 04/07/2012, 5:52 AM

## 2012-04-07 NOTE — Care Management Note (Signed)
    Page 1 of 1   04/07/2012     10:57:13 AM   CARE MANAGEMENT NOTE 04/07/2012  Patient:  CRANSTON, KOORS   Account Number:  [id number]  Date Initiated:  04/07/2012  Documentation initiated by:  Lanier Clam  Subjective/Objective Assessment:   ADMITTED W/BLADDER CA.     Action/Plan:   FROM HOME.HAS PCP,PHARMACY.   Anticipated DC Date:  04/07/2012   Anticipated DC Plan:  HOME/SELF CARE      DC Planning Services  CM consult      Choice offered to / List presented to:             Status of service:  Completed, signed off Medicare Important Message given?   (If response is "NO", the following Medicare IM given date fields will be blank) Date Medicare IM given:   Date Additional Medicare IM given:    Discharge Disposition:  HOME/SELF CARE  Per UR Regulation:  Reviewed for med. necessity/level of care/duration of stay  If discussed at Long Length of Stay Meetings, dates discussed:    Comments:  04/07/12 Eriona Kinchen RN,BSN NCM [phone] POD#1 CYSTOSCOPY,TUR.

## 2012-04-07 NOTE — Op Note (Signed)
Preoperative diagnosis:Urothelial carcinoma of the bladder  Postoperative diagnosis:Urothelial carcinoma of the bladder  Procedure: anesthetic cystoscopy, left retrograde ureteropyelogram, left renal washings, interpretation of fluoroscopy, attempted right retrograde ureteral pyelogram, TURBT of a 1.5 x 2.5 cm urothelial carcinoma of the right bladder neck, biopsy of right trigone/ureteral orifice area, placement of mitomycin within the bladder  Surgeon: Bertram Millard. Tamea Bai, M.D.   Anesthesia: Gen.   Complications:none  Specimen(s):left renal washings for cytology, right bladder neck tumor, right trigone/ureteral orifice biopsy  Drain(s):20 [place], 5 cc Foley catheter to dependent drainage  Indications:78 year old male with recurrent urothelial carcinoma the bladder. Unfortunately, this has been resistant to treatment with BCGand mitomycin. Recent cystoscopy revealed recurrence of a bladder neck lesion. He presentsfor anesthetic cystoscopy, bilateral retrograde ureteral pyelograms and TURBT.   Technique and findingsThe patient was properly identified in the holding area and received preoperative IV antibiotics. He was then taken to the operating room where general anesthetic was administered with the LMA. He was placed in the dorsal lithotomy position. Genitalia and perineum were prepped and draped. Time out was informed.   I then passed a 22 French panendoscope through his urethra. It was without stricture or lesion. Prostate was nonobstructive. The bladder neck was somewhat fixed, making it difficult to pass the scope into the bladder. Once in the bladder, the bladder was inspected circumferentially with both the 20 and the 70 lenses. The left ureteral orifice was normal in configuration and location. The right ureteral orifice was obscured due to what seemed like a nodular growth, although there was no superficial urothelial abnormality. There was at the right bladder neck, approximately  1-2 cm from the right ureteral orifice, a urothelial carcinoma which was approximately 1.5 x 2.5 cm. There were no other urothelial abnormalities the bladder, which demonstrated mild trabeculations.  I then cannulated the left ureteral orifice with an open-ended catheter. A retrograde ureteropyelogram was performed.   The retrograde ureteropyelogram was normal, with a normal ureter without filling defects or hydroureter. The renal pelvis and calyceal system on the left was normal. There was no evidence of filling defects.   I advanced the ureteral catheter up to the renal pelvis, and performed washings. Saline was used for this. The washings were sent as "left renal washings" for cytology.  I then attempted to cannulate the right ureter orifice. This was unsuccessful, even with using the Albarrans bridge using a 70 lens. After multiple attempts, I then aborted the retrograde pyelogram on the right.  I passed the resectoscope sheath using the direct visual obturator. Again, passage was somewhat difficult due to fixation of the bladder neck. The cutting loop and the resectoscope colon were then placed. I then visualized the urothelial lesion of the right bladder neck. Using the gyrus device, I then resected this down to the muscular layer. It was quite difficult to resect so close to the bladder neck, due to fixation of the bladder neck from prior radiation. However, I felt like I totally rechecked it this lesion of the right bladder neck. This was labeled "right bladder neck tumor". It was sent for permanent pathology.  I then cauterized the base of the resected tumor. His area was hemostatic. I then used the resectoscope loop to biopsy the right ureteral orifice area. This was sent as "right trigone". I did not cauterize this area due to its location near the ureteral orifice.  At this point, with the resected areas being hemostatic, I removed the resectoscope and passed a 20 French Foley catheter. 10  cc of water was placed in the balloon. This was hooked to dependent drainage until the patient got to the recovery room. At that point, 40 mg of mitomycin were placed within the bladder, and the catheter was plugged.  The patient tolerated the procedure well. He was taken to the PACU in stable condition.

## 2012-04-07 NOTE — Progress Notes (Signed)
Pt is to be discharged home today. Pt is in NAD, IV is out, all paperwork has been reviewed/discussed with patient, and there are no questions/concerns at this time. Assessment is unchanged from this morning. Pt is to be accompanied downstairs by staff and family via wheelchair.  

## 2012-04-07 NOTE — Progress Notes (Signed)
Pt has voided 3x since foley was D/C'd. Urine is slightly bloody, but there are no clots or sediment seen.

## 2012-04-30 NOTE — Progress Notes (Signed)
Received copy of Cytopathology Report, report of Sug[facility] Pathology; also received CT-abd/pelvis from Dr. Marcine Matar @ [facility]; forwarded to Dr. Clelia Croft.

## 2012-12-02 ENCOUNTER — Other Ambulatory Visit: Payer: Self-pay | Admitting: Urology

## 2012-12-23 ENCOUNTER — Encounter (HOSPITAL_COMMUNITY): Payer: Self-pay | Admitting: Pharmacy Technician

## 2012-12-25 NOTE — Patient Instructions (Addendum)
[address]  12/25/2012   Your procedure is scheduled on: 12/31/12  Report to [facility] at 9:30 AM.  Call this number if you have problems the morning of surgery [phone]: [phone]   Remember: please do not take any diabetic medicine on the day of surgery   Do not eat food or drink liquids After Midnight.     Take these medicines the morning of surgery with A SIP OF WATER: amlodipine, metoprolol, simvastatin   Do not wear jewelry, make-up or nail polish.  Do not wear lotions, powders, or perfumes. You may wear deodorant.  Do not shave 48 hours prior to surgery. Men may shave face and neck.  Do not bring valuables to the hospital.  Contacts, dentures or bridgework may not be worn into surgery.  Leave suitcase in the car. After surgery it may be brought to your room.  For patients admitted to the hospital, checkout time is 11:00 AM the day of discharge.  Birdie Sons, RN  pre op nurse call if needed [phone]    FAILURE TO FOLLOW THESE INSTRUCTIONS MAY RESULT IN CANCELLATION OF YOUR SURGERY   Patient Signature: ___________________________________________

## 2012-12-25 NOTE — Progress Notes (Signed)
LOV note Dr. Lowell Guitar 10/13/12 on chart, Chest x-ray 04/01/12 on EPIC

## 2012-12-28 ENCOUNTER — Encounter (HOSPITAL_COMMUNITY): Payer: Self-pay

## 2012-12-28 ENCOUNTER — Encounter (INDEPENDENT_AMBULATORY_CARE_PROVIDER_SITE_OTHER): Payer: Self-pay

## 2012-12-28 ENCOUNTER — Encounter (HOSPITAL_COMMUNITY)
Admission: RE | Admit: 2012-12-28 | Discharge: 2012-12-28 | Disposition: A | Discharge status: Home / Self Care | Payer: Medicare Other | Source: Ambulatory Visit | Attending: Urology | Admitting: Urology

## 2012-12-28 HISTORY — DX: Pure hypercholesterolemia, unspecified: E78.00

## 2012-12-28 HISTORY — DX: Foot drop, right foot: M21.371

## 2012-12-28 HISTORY — DX: Age-related osteoporosis without current pathological fracture: M81.0

## 2012-12-28 LAB — CBC
MCHC: 33.1 g/dL (ref 30.0–36.0)
RDW: 14.2 % (ref 11.5–15.5)
WBC: 12.4 10*3/uL — ABNORMAL HIGH (ref 4.0–10.5)

## 2012-12-28 LAB — BASIC METABOLIC PANEL
Chloride: 105 mEq/L (ref 96–112)
GFR calc Af Amer: 29 mL/min — ABNORMAL LOW (ref >90)
GFR calc non Af Amer: 25 mL/min — ABNORMAL LOW (ref >90)
Potassium: 4.8 mEq/L (ref 3.5–5.1)
Sodium: 141 mEq/L (ref 135–145)

## 2012-12-31 ENCOUNTER — Observation Stay (HOSPITAL_COMMUNITY)
Admission: RE | Admit: 2012-12-31 | Discharge: 2013-01-02 | Disposition: A | Discharge status: Home / Self Care | Payer: Medicare Other | Source: Ambulatory Visit | Attending: Urology | Admitting: Urology

## 2012-12-31 ENCOUNTER — Encounter (HOSPITAL_COMMUNITY): Payer: Self-pay | Admitting: *Deleted

## 2012-12-31 ENCOUNTER — Encounter (HOSPITAL_COMMUNITY)
Admission: RE | Disposition: A | Discharge status: Home / Self Care | Payer: Self-pay | Source: Ambulatory Visit | Attending: Urology

## 2012-12-31 ENCOUNTER — Encounter (HOSPITAL_COMMUNITY): Payer: Medicare Other | Admitting: Registered Nurse

## 2012-12-31 ENCOUNTER — Ambulatory Visit (HOSPITAL_COMMUNITY): Payer: Medicare Other | Admitting: Registered Nurse

## 2012-12-31 DIAGNOSIS — R21 Rash and other nonspecific skin eruption: Secondary | ICD-10-CM | POA: Insufficient documentation

## 2012-12-31 DIAGNOSIS — E1029 Type 1 diabetes mellitus with other diabetic kidney complication: Secondary | ICD-10-CM | POA: Insufficient documentation

## 2012-12-31 DIAGNOSIS — Z794 Long term (current) use of insulin: Secondary | ICD-10-CM | POA: Insufficient documentation

## 2012-12-31 DIAGNOSIS — R339 Retention of urine, unspecified: Secondary | ICD-10-CM | POA: Insufficient documentation

## 2012-12-31 DIAGNOSIS — R310 Gross hematuria: Secondary | ICD-10-CM | POA: Insufficient documentation

## 2012-12-31 DIAGNOSIS — N320 Bladder-neck obstruction: Secondary | ICD-10-CM | POA: Insufficient documentation

## 2012-12-31 DIAGNOSIS — M810 Age-related osteoporosis without current pathological fracture: Secondary | ICD-10-CM | POA: Insufficient documentation

## 2012-12-31 DIAGNOSIS — Z87891 Personal history of nicotine dependence: Secondary | ICD-10-CM | POA: Insufficient documentation

## 2012-12-31 DIAGNOSIS — I129 Hypertensive chronic kidney disease with stage 1 through stage 4 chronic kidney disease, or unspecified chronic kidney disease: Secondary | ICD-10-CM | POA: Insufficient documentation

## 2012-12-31 DIAGNOSIS — I739 Peripheral vascular disease, unspecified: Secondary | ICD-10-CM | POA: Insufficient documentation

## 2012-12-31 DIAGNOSIS — D090 Carcinoma in situ of bladder: Principal | ICD-10-CM | POA: Insufficient documentation

## 2012-12-31 DIAGNOSIS — E785 Hyperlipidemia, unspecified: Secondary | ICD-10-CM | POA: Insufficient documentation

## 2012-12-31 DIAGNOSIS — N189 Chronic kidney disease, unspecified: Secondary | ICD-10-CM | POA: Insufficient documentation

## 2012-12-31 DIAGNOSIS — Z79899 Other long term (current) drug therapy: Secondary | ICD-10-CM | POA: Insufficient documentation

## 2012-12-31 DIAGNOSIS — Z8547 Personal history of malignant neoplasm of testis: Secondary | ICD-10-CM | POA: Insufficient documentation

## 2012-12-31 DIAGNOSIS — C679 Malignant neoplasm of bladder, unspecified: Secondary | ICD-10-CM

## 2012-12-31 DIAGNOSIS — Z7982 Long term (current) use of aspirin: Secondary | ICD-10-CM | POA: Insufficient documentation

## 2012-12-31 DIAGNOSIS — N289 Disorder of kidney and ureter, unspecified: Secondary | ICD-10-CM | POA: Insufficient documentation

## 2012-12-31 DIAGNOSIS — E7800 Pure hypercholesterolemia, unspecified: Secondary | ICD-10-CM | POA: Insufficient documentation

## 2012-12-31 HISTORY — PX: TRANSURETHRAL RESECTION OF PROSTATE: SHX73

## 2012-12-31 HISTORY — PX: TRANSURETHRAL RESECTION OF BLADDER TUMOR WITH GYRUS (TURBT-GYRUS): SHX6458

## 2012-12-31 LAB — GLUCOSE, CAPILLARY
Glucose-Capillary: 95 mg/dL (ref 70–99)
Glucose-Capillary: 89 mg/dL (ref 70–99)

## 2012-12-31 SURGERY — TRANSURETHRAL RESECTION OF BLADDER TUMOR WITH GYRUS (TURBT-GYRUS)
Anesthesia: General | Wound class: Clean Contaminated

## 2012-12-31 MED ORDER — ALLOPURINOL 100 MG PO TABS
100.0000 mg | ORAL_TABLET | Freq: Every day | ORAL | Status: DC
  Administered 2012-12-31 – 2013-01-02 (×3): 100 mg via ORAL
  Filled 2012-12-31 (×3): qty 1

## 2012-12-31 MED ORDER — PROMETHAZINE HCL 25 MG/ML IJ SOLN: 6.2500 mg | INTRAMUSCULAR | Status: DC | PRN

## 2012-12-31 MED ORDER — ONDANSETRON HCL 4 MG/2ML IJ SOLN
INTRAMUSCULAR | Status: DC | PRN
  Administered 2012-12-31: 4 mg via INTRAVENOUS

## 2012-12-31 MED ORDER — FENTANYL CITRATE 0.05 MG/ML IJ SOLN
INTRAMUSCULAR | Status: DC | PRN
  Administered 2012-12-31: 25 ug via INTRAVENOUS

## 2012-12-31 MED ORDER — HYDROCODONE-ACETAMINOPHEN 5-325 MG PO TABS: 1.0000 | ORAL_TABLET | ORAL | Status: DC | PRN

## 2012-12-31 MED ORDER — LACTATED RINGERS IV SOLN
INTRAVENOUS | Status: DC | PRN
  Administered 2012-12-31: 11:00:00 via INTRAVENOUS

## 2012-12-31 MED ORDER — DEXAMETHASONE SODIUM PHOSPHATE 10 MG/ML IJ SOLN
INTRAMUSCULAR | Status: DC | PRN
  Administered 2012-12-31: 10 mg via INTRAVENOUS

## 2012-12-31 MED ORDER — ZOLPIDEM TARTRATE 5 MG PO TABS: 5.0000 mg | ORAL_TABLET | Freq: Every evening | ORAL | Status: DC | PRN

## 2012-12-31 MED ORDER — PROPOFOL 10 MG/ML IV BOLUS
INTRAVENOUS | Status: DC | PRN
  Administered 2012-12-31: 150 mg via INTRAVENOUS

## 2012-12-31 MED ORDER — BELLADONNA ALKALOIDS-OPIUM 16.2-60 MG RE SUPP
RECTAL | Status: DC | PRN
Start: 2012-12-31 — End: 2012-12-31
  Administered 2012-12-31: 1 via RECTAL

## 2012-12-31 MED ORDER — CIPROFLOXACIN HCL 250 MG PO TABS: 250.0000 mg | ORAL_TABLET | Freq: Two times a day (BID) | ORAL | Status: DC

## 2012-12-31 MED ORDER — CIPROFLOXACIN HCL 250 MG PO TABS
250.0000 mg | ORAL_TABLET | Freq: Two times a day (BID) | ORAL | Status: AC
  Administered 2012-12-31: 250 mg via ORAL
  Filled 2012-12-31: qty 1

## 2012-12-31 MED ORDER — PNEUMOCOCCAL VAC POLYVALENT 25 MCG/0.5ML IJ INJ
0.5000 mL | INJECTION | INTRAMUSCULAR | Status: DC
  Filled 2012-12-31 (×2): qty 0.5

## 2012-12-31 MED ORDER — BELLADONNA ALKALOIDS-OPIUM 16.2-60 MG RE SUPP
RECTAL | Status: AC
  Filled 2012-12-31: qty 1

## 2012-12-31 MED ORDER — EPHEDRINE SULFATE 50 MG/ML IJ SOLN
INTRAMUSCULAR | Status: DC | PRN
  Administered 2012-12-31 (×3): 5 mg via INTRAVENOUS

## 2012-12-31 MED ORDER — DOCUSATE SODIUM 100 MG PO CAPS
100.0000 mg | ORAL_CAPSULE | Freq: Two times a day (BID) | ORAL | Status: DC
  Administered 2012-12-31 – 2013-01-01 (×2): 100 mg via ORAL
  Filled 2012-12-31 (×5): qty 1

## 2012-12-31 MED ORDER — CEFAZOLIN SODIUM-DEXTROSE 2-3 GM-% IV SOLR
INTRAVENOUS | Status: AC
  Filled 2012-12-31: qty 50

## 2012-12-31 MED ORDER — LIDOCAINE HCL (CARDIAC) 20 MG/ML IV SOLN
INTRAVENOUS | Status: DC | PRN
  Administered 2012-12-31: 80 mg via INTRAVENOUS

## 2012-12-31 MED ORDER — SIMVASTATIN 20 MG PO TABS
20.0000 mg | ORAL_TABLET | Freq: Every day | ORAL | Status: DC
  Administered 2013-01-01: 20 mg via ORAL
  Filled 2012-12-31 (×2): qty 1

## 2012-12-31 MED ORDER — SODIUM CHLORIDE 0.9 % IR SOLN
Status: DC | PRN
  Administered 2012-12-31: 9000 mL

## 2012-12-31 MED ORDER — FUROSEMIDE 20 MG PO TABS
20.0000 mg | ORAL_TABLET | Freq: Every day | ORAL | Status: DC
  Administered 2012-12-31 – 2013-01-02 (×3): 20 mg via ORAL
  Filled 2012-12-31 (×3): qty 1

## 2012-12-31 MED ORDER — SODIUM CHLORIDE 0.45 % IV SOLN
INTRAVENOUS | Status: DC
  Administered 2012-12-31 – 2013-01-01 (×3): via INTRAVENOUS

## 2012-12-31 MED ORDER — FENTANYL CITRATE 0.05 MG/ML IJ SOLN
INTRAMUSCULAR | Status: AC
  Filled 2012-12-31: qty 2

## 2012-12-31 MED ORDER — HYDRALAZINE HCL 25 MG PO TABS
25.0000 mg | ORAL_TABLET | Freq: Three times a day (TID) | ORAL | Status: DC
  Administered 2012-12-31 – 2013-01-02 (×6): 25 mg via ORAL
  Filled 2012-12-31 (×8): qty 1

## 2012-12-31 MED ORDER — BELLADONNA ALKALOIDS-OPIUM 16.2-60 MG RE SUPP: 1.0000 | Freq: Four times a day (QID) | RECTAL | Status: DC | PRN

## 2012-12-31 MED ORDER — METOPROLOL SUCCINATE ER 100 MG PO TB24
100.0000 mg | ORAL_TABLET | Freq: Every day | ORAL | Status: DC
  Administered 2013-01-01 – 2013-01-02 (×2): 100 mg via ORAL
  Filled 2012-12-31 (×2): qty 1

## 2012-12-31 MED ORDER — FENTANYL CITRATE 0.05 MG/ML IJ SOLN
25.0000 ug | INTRAMUSCULAR | Status: DC | PRN
  Administered 2012-12-31 (×2): 50 ug via INTRAVENOUS

## 2012-12-31 MED ORDER — ONDANSETRON HCL 4 MG/2ML IJ SOLN: 4.0000 mg | INTRAMUSCULAR | Status: DC | PRN

## 2012-12-31 MED ORDER — AMLODIPINE BESYLATE 10 MG PO TABS
10.0000 mg | ORAL_TABLET | Freq: Every day | ORAL | Status: DC
  Administered 2013-01-01 – 2013-01-02 (×2): 10 mg via ORAL
  Filled 2012-12-31 (×2): qty 1

## 2012-12-31 MED ORDER — INSULIN GLARGINE 100 UNIT/ML ~~LOC~~ SOLN
9.0000 [IU] | Freq: Every day | SUBCUTANEOUS | Status: DC
  Administered 2013-01-01 – 2013-01-02 (×2): 9 [IU] via SUBCUTANEOUS
  Filled 2012-12-31 (×2): qty 0.09

## 2012-12-31 MED ORDER — CEFAZOLIN SODIUM-DEXTROSE 2-3 GM-% IV SOLR
2.0000 g | INTRAVENOUS | Status: AC
  Administered 2012-12-31: 2 g via INTRAVENOUS

## 2012-12-31 MED ORDER — INSULIN PEN NEEDLE 31G X 8 MM MISC: 1.0000 | Freq: Four times a day (QID) | Status: DC

## 2012-12-31 SURGICAL SUPPLY — 19 items
BAG URINE DRAINAGE (UROLOGICAL SUPPLIES) ×2 IMPLANT
BAG URO CATCHER STRL LF (DRAPE) ×2 IMPLANT
CATH FOLEY 3WAY 30CC 22FR (CATHETERS) ×2 IMPLANT
CLOTH BEACON ORANGE TIMEOUT ST (SAFETY) ×2 IMPLANT
DRAPE CAMERA CLOSED 9X96 (DRAPES) ×2 IMPLANT
ELECT BUTTON HF 24-28F 2 30DE (ELECTRODE) IMPLANT
ELECT HF RESECT BIPO 24F 45 ND (CUTTING LOOP) IMPLANT
ELECT LOOP MED HF 24F 12D (CUTTING LOOP) IMPLANT
ELECT LOOP MED HF 24F 12D CBL (CLIP) IMPLANT
EVACUATOR MICROVAS BLADDER (UROLOGICAL SUPPLIES) ×2 IMPLANT
GLOVE BIOGEL M 8.0 STRL (GLOVE) ×2 IMPLANT
GOWN STRL REIN XL XLG (GOWN DISPOSABLE) ×2 IMPLANT
HOLDER FOLEY CATH W/STRAP (MISCELLANEOUS) IMPLANT
IV NS IRRIG 3000ML ARTHROMATIC (IV SOLUTION) ×6 IMPLANT
KIT ASPIRATION TUBING (SET/KITS/TRAYS/PACK) ×2 IMPLANT
MANIFOLD NEPTUNE II (INSTRUMENTS) ×2 IMPLANT
PACK CYSTO (CUSTOM PROCEDURE TRAY) ×2 IMPLANT
SYR 30ML LL (SYRINGE) ×2 IMPLANT
TUBING CONNECTING 10 (TUBING) ×2 IMPLANT

## 2012-12-31 NOTE — Anesthesia Postprocedure Evaluation (Signed)
  Anesthesia Post-op Note  Patient: [name]  Procedure(s) Performed: Procedure(s) (LRB): TRANSURETHRAL RESECTION OF BLADDER TUMOR WITH GYRUS (TURBT-GYRUS) (N/A) TRANSURETHRAL RESECTION OF THE PROSTATE WITH GYRUS INSTRUMENTS (N/A)  Patient Location: PACU  Anesthesia Type: General  Level of Consciousness: awake and alert   Airway and Oxygen Therapy: Patient Spontanous Breathing  Post-op Pain: mild  Post-op Assessment: Post-op Vital signs reviewed, Patient's Cardiovascular Status Stable, Respiratory Function Stable, Patent Airway and No signs of Nausea or vomiting  Last Vitals:  Filed Vitals:   12/31/12 1415  BP:   Pulse:   Temp: 35.7 C  Resp:     Post-op Vital Signs: stable   Complications: No apparent anesthesia complications

## 2012-12-31 NOTE — Anesthesia Preprocedure Evaluation (Addendum)
Anesthesia Evaluation  Patient identified by MRN, date of birth, ID band Patient awake    Reviewed: Allergy & Precautions, H&P , NPO status , Patient's Chart, lab work & pertinent test results  Airway Mallampati: III TM Distance: <3 FB Neck ROM: Limited    Dental no notable dental hx.    Pulmonary neg pulmonary ROS,  breath sounds clear to auscultation  Pulmonary exam normal       Cardiovascular hypertension, Rhythm:Regular Rate:Bradycardia     Neuro/Psych negative neurological ROS  negative psych ROS   GI/Hepatic negative GI ROS, Neg liver ROS,   Endo/Other  negative endocrine ROSdiabetes  Renal/GU negative Renal ROS  negative genitourinary   Musculoskeletal negative musculoskeletal ROS (+)   Abdominal   Peds negative pediatric ROS (+)  Hematology negative hematology ROS (+)   Anesthesia Other Findings   Reproductive/Obstetrics negative OB ROS                          Anesthesia Physical Anesthesia Plan  ASA: III  Anesthesia Plan: General   Post-op Pain Management:    Induction: Intravenous  Airway Management Planned: LMA  Additional Equipment:   Intra-op Plan:   Post-operative Plan:   Informed Consent: I have reviewed the patients History and Physical, chart, labs and discussed the procedure including the risks, benefits and alternatives for the proposed anesthesia with the patient or authorized representative who has indicated his/her understanding and acceptance.   Dental advisory given  Plan Discussed with: CRNA and Surgeon  Anesthesia Plan Comments:         Anesthesia Quick Evaluation

## 2012-12-31 NOTE — H&P (Signed)
Urology History and Physical Exam  CC: Bladder cancer  HPI: 78 year old Male presents for repeat TURBT.  His history is as follows:   This man comes in today for followup of urothelial carcinoma of the bladder. He originally presented to Dr. Aldean Ast on 02/01/2010. At that time he had 2 episodes of gross, painless hematuria. He had negative cultures prior to presentation. Evaluation included cystoscopy and CT abdomen and pelvis. CT revealed no evidence of renal disease, and no abnormalities within the bladder. Cystoscopy revealed a 1.5 cm papillary tumor at the base of the bladder, with a nearby 3 cm sessile tumor. He underwent TURBT on 02/27/2010. He tolerated the procedure well. Pathology revealed invasive high-grade urothelial carcinoma with some muscular involvement. The TURBT was performed on 04/24/2010. The bladder base revealed high-grade papillary urothelial carcinoma with adjacent carcinoma in situ, with no evidence of tumor involvement in the muscle. There was lamina propria invasion/involvement, however. The right trigone revealed invasive high-grade urothelial carcinoma with adjacent urothelial carcinoma in situ. There was slight muscular involvement.  He was seen by both Dr. Clelia Croft and Dr. Dayton Scrape. The patient was not felt to be a candidate for chemotherapy/radiotherapy. He did have abdominal/retroperitoneal radiation in the mid 60s, for testicular cancer. He underwent orchiectomy with radiation and retroperitoneal lymph node dissection. He is cancer free regarding testicular cancer.  He was last seen by Dr. Aldean Ast in April, 2012. At that time he was to have been scheduled for BCG. Unfortunately, he had an orthopedic injury, requiring surgery and rehabilitation. I first saw him on 10/04/2010 and strongly recommended intravesical BCG treatments. 6 induction treatments were completed 11/07/2010.  In November, 2012 he underwent surveillance cystoscopy. This revealed erythema in his trigonal  region. Cytologies were positive. He underwent anesthetic cystoscopy and biopsy on 02/15/2011. This revealed carcinoma in situ, with no evidence of muscular involvement. Muscle was present in the specimen.  He underwent repeat BCG induction was was completed in February, 2013. He tolerated that well.  Cystoscopy in June, 2013 was essentially negative the washings were positive for urothelial carcinoma.  He subsequently underwent 6 intravesical treatments of mitomycin which began early September, ending [facility] 12[facility]10[facility]. He tolerated those well.  In February, 2014 he had positive cytology on office cystoscopy, and underwent anesthetic cystoscopy, TUR of a right-sided bladder tumor and right trigonal biopsy. These revealed high-grade invasive urothelial carcinoma, with a focus of muscular or some invasion. He did have mitomycin placed at the time of that resection. Because of his age and medical issues, the patient and his wife did not want more aggressive management of his invasive disease. They elected local control.  Recent repeat cystoscopy October 8 revealed a couple recurrent lesions in his bladder neck on the right.  He has had intermittent gross hematuria.  He is here today for repeat TURBT.   PMH: Past Medical History  Diagnosis Date  . Hypertension   . Heart murmur   . Gout     no attack in years  . Chronic kidney disease     RENAL INSUFFICIENCY  . Diabetes mellitus   . Bladder cancer   . Testicular cancer     1965  . Hypercholesteremia   . Osteoporosis   . Right foot drop     PSH: Past Surgical History  Procedure Laterality Date  . Orchiectomy  1965    right  . Turbt    . Fx femur  2006  . Cystoscopy w/ retrogrades  02/15/2011    Procedure: CYSTOSCOPY WITH RETROGRADE PYELOGRAM;  Surgeon: Marcine Matar, MD;  Location: [facility];  Service: Urology;  Laterality: Left;  Cystoscopy, Transurethral Resection of Bladder Tumor, Left Retrograde  Pyelograms    . Transurethral resection of bladder tumor  02/15/2011    Procedure: TRANSURETHRAL RESECTION OF BLADDER TUMOR (TURBT);  Surgeon: Marcine Matar, MD;  Location: [facility];  Service: Urology;  Laterality: N/A;  . Cystoscopy w/ retrogrades Bilateral 04/06/2012    Procedure: CYSTOSCOPY WITH RETROGRADE PYELOGRAM;  Surgeon: Marcine Matar, MD;  Location: [facility];  Service: Urology;  Laterality: Bilateral;  CYSTO, TURBT, BILATERAL RETROGRADES AND INSTILLATION Mitomycin C  . Transurethral resection of bladder tumor N/A 04/06/2012    Procedure: TRANSURETHRAL RESECTION OF BLADDER TUMOR (TURBT);  Surgeon: Marcine Matar, MD;  Location: [facility];  Service: Urology;  Laterality: N/A;  . Tonsillectomy  as child  . Cataract extraction Right     Allergies: No Known Allergies  Medications: Prescriptions prior to admission  Medication Sig Dispense Refill  . allopurinol (ZYLOPRIM) 100 MG tablet Take 100 mg by mouth daily.       Marland Kitchen amLODipine (NORVASC) 10 MG tablet Take 10 mg by mouth daily.      Marland Kitchen CALCIUM PO Take 1 tablet by mouth daily.      . Cholecalciferol (VITAMIN D PO) Take 1 tablet by mouth daily.      . furosemide (LASIX) 40 MG tablet Take 20 mg by mouth every morning.       . hydrALAZINE (APRESOLINE) 25 MG tablet Take 25 mg by mouth 3 (three) times daily.       . insulin glargine (LANTUS) 100 UNIT/ML injection Inject 9 Units into the skin every morning.       . insulin lispro (HUMALOG) 100 UNIT/ML injection Inject 3-4 Units into the skin 3 (three) times daily before meals.      . Insulin Pen Needle 31G X 8 MM MISC Inject 1 Device into the skin 4 (four) times daily.  100 each  11  . metoprolol succinate (TOPROL-XL) 100 MG 24 hr tablet Take 100 mg by mouth daily. Take with or immediately following a meal.      . simvastatin (ZOCOR) 20 MG tablet Take 20 mg by mouth every morning.       . vitamin B-12 (CYANOCOBALAMIN) 500 MCG tablet Take 500 mcg by mouth daily.       Marland Kitchen aspirin 81 MG  tablet Take 81 mg by mouth daily.         Social History: History   Social History  . Marital Status: Married    Spouse Name: N/A    Number of Children: N/A  . Years of Education: N/A   Occupational History  .      retired   Social History Main Topics  . Smoking status: Former Smoker    Types: Cigarettes    Quit date: 02/26/1968  . Smokeless tobacco: Never Used  . Alcohol Use: No  . Drug Use: No  . Sexual Activity: Not on file   Other Topics Concern  . Not on file   Social History Narrative  . No narrative on file    Family History: Family History  Problem Relation Age of Onset  . Diabetes Father   . Diabetes Sister     Review of Systems: Positive: Gross hematuria Negative:  A further 10 point review of systems was negative except what is listed in the HPI.  Physical Exam: @VITALS2 @ General:  No acute distress.  Awake. Head:  Normocephalic.  Atraumatic. ENT:  EOMI.  Mucous membranes moist Neck:  Supple.  No lymphadenopathy. CV:  S1 present. S2 present. Regular rate. Pulmonary: Equal effort bilaterally.  Clear to auscultation bilaterally. Abdomen: Firm, secondary to previous radiotherapy.  However, his abdomen is non-tender to palpation.Examination is unchanged from prior Skin:  Normal turgor.  No visible rash. Extremity: No gross deformity of bilateral upper extremities.  No gross deformity of    bilateral lower extremities. Neurologic: Alert. Appropriate mood.  Penis:  circumcised.  No lesions.   Studies:  No results found for this basename: HGB, WBC, PLT,  in the last 72 hours  No results found for this basename: NA, K, CL, CO2, BUN, CREATININE, CALCIUM, MAGNESIUM, GFRNONAA, GFRAA,  in the last 72 hours   No results found for this basename: PT, INR, APTT,  in the last 72 hours   No components found with this basename: ABG,     Assessment:  Recurrent urothelial carcinoma of the bladder  Plan: Repeat TURBT.

## 2012-12-31 NOTE — Preoperative (Signed)
Beta Blockers   Reason not to administer Beta Blockers:Metoprolol taken at 0730 12-31-12

## 2012-12-31 NOTE — Op Note (Signed)
Preoperative diagnosis:Recurrent urothelial carcinoma of the bladder, bladder outlet obstruction  Postoperative diagnosis:Same   Procedure: Anesthetic cystoscopy, TURBT of a 3 cm bladder tumor, TURBT    Surgeon: Bertram Millard. Dameer Speiser, M.D.   Anesthesia: Gen.   Complications: None  Specimen(s):Bladder tumor/prostatic shavings in one specimen  Drain(s): 22 French three-way Foley catheter to CBI with saline  Indications: 78 year-old male with recurrent urothelial carcinoma of the bladder. This has been found to be muscle invasive, but has all been localized. The patient is not a candidate for extricate of surgery i.e. cystectomy. In October, on surveillance cystoscopy, he was found to have recurrent urothelial carcinoma of the bladder neck, both the right and left sides. Additionally, he has significant obstructive symptomatology from a fibrotic but obstructive prostate. He has had abdominal and pelvic radiotherapy years ago for testicular cancer.  Preoperatively, the patient was counseled about repeat TURBT. His wife does not want him having a cystectomy. He presents at this time for palliative resection of recurrent urothelial carcinoma as well as TURP to the improve his bladder outlet obstructive symptomatology.    Technique and findings: The patient was properly identified in the holding area. He received preoperative IV antibiotics. He was taken to the operating room where general anesthetic was administered with the LMA. He was placed in the dorsolithotomy position. Genitalia and perineum were prepped and draped. Upper timeout was then performed.  I then advanced a 27 French resectoscope sheath using the obturator through his urethra and his bladder. The anterior urethra was very fibrotic, and this made angulation of the sheath somewhat difficult. I then placed the resectoscope element with the 12 lens. Circumferential inspection of the bladder was then performed. There were moderate  trabeculations. Ureteral orifices were normal in configuration and location. The only bladder lesions noted were actually at the bladder neck. He started from approximately the 10:00 position to the 4:00 position when rotating counterclockwise. These and dated the most inferior portion of the trigone. No other bladder lesions were noted. Using the cutting loop, I first resected the bladder tumor high on the bladder neck. Significant prostatic secretions were liberated at this point. I then resected the prostate, from the bladder neck to the area of the verumontanum. This remove the fibrotic, obstructive tissue. I did not need to perform an aggressive resection, as the fibrotic tissue allowed for the prostatic fossa to stay open. Thorough inspection of the resected site revealed no more papillary tumor. There was adequate hemostasis within the bladder neck and prostatic fossa area. At this point, the prostatic and bladder tumor chips were irrigated from the bladder using Microvasive evacuator. I then reinspected the bladder. No further tissue was remaining, and there was adequate hemostasis. The resectoscope sheath was then removed. I placed a 22 French three-way Foley catheter until the balloon with 30 cc of water. This was hooked to CBI using normal saline.  The patient tolerated procedure well. He was awakened and then taken to the PACU in stable condition.

## 2012-12-31 NOTE — Care Management Note (Signed)
    Page 1 of 1   12/31/2012     4:08:49 PM   CARE MANAGEMENT NOTE 12/31/2012  Patient:  [name], [name]   Account Number:  [id number]  Date Initiated:  12/31/2012  Documentation initiated by:  Lanier Clam  Subjective/Objective Assessment:   78 Y/O M ADMITTED W/BLADDER CA.     Action/Plan:   FROM HOME W/SPOUSE.HAS PCP,PHARMACY.   Anticipated DC Date:  12/31/2012   Anticipated DC Plan:  HOME/SELF CARE      DC Planning Services  CM consult      Choice offered to / List presented to:             Status of service:  In process, will continue to follow Medicare Important Message given?   (If response is "NO", the following Medicare IM given date fields will be blank) Date Medicare IM given:   Date Additional Medicare IM given:    Discharge Disposition:    Per UR Regulation:  Reviewed for med. necessity/level of care/duration of stay  If discussed at Long Length of Stay Meetings, dates discussed:    Comments:  12/31/12 Maheen Cwikla RN,BSN NCM [phone] S/P TURP.NO ANTICIPATED D/C NEEDS.

## 2012-12-31 NOTE — Transfer of Care (Signed)
Immediate Anesthesia Transfer of Care Note  Patient: [name] Patient  Procedure(s) Performed: Procedure(s): TRANSURETHRAL RESECTION OF BLADDER TUMOR WITH GYRUS (TURBT-GYRUS) (N/A) TRANSURETHRAL RESECTION OF THE PROSTATE WITH GYRUS INSTRUMENTS (N/A)  Patient Location: PACU  Anesthesia Type:General  Level of Consciousness: awake, alert , oriented and patient cooperative  Airway & Oxygen Therapy: Patient Spontanous Breathing and Patient connected to face mask oxygen  Post-op Assessment: Report given to PACU RN, Post -op Vital signs reviewed and stable and Patient moving all extremities  Post vital signs: Reviewed and stable  Complications: No apparent anesthesia complications

## 2012-12-31 NOTE — Anesthesia Postprocedure Evaluation (Deleted)
  Anesthesia Post-op Note  Patient: [name]  Procedure(s) Performed: Procedure(s) (LRB): TRANSURETHRAL RESECTION OF BLADDER TUMOR WITH GYRUS (TURBT-GYRUS) (N/A) TRANSURETHRAL RESECTION OF THE PROSTATE WITH GYRUS INSTRUMENTS (N/A)  Patient Location: PACU  Anesthesia Type: General  Level of Consciousness: awake and alert   Airway and Oxygen Therapy: Patient Spontanous Breathing  Post-op Pain: mild  Post-op Assessment: Post-op Vital signs reviewed, Patient's Cardiovascular Status Stable, Respiratory Function Stable, Patent Airway and No signs of Nausea or vomiting  Last Vitals:  Filed Vitals:   12/31/12 0928  BP: 146/62  Pulse: 59  Temp: 36.5 C  Resp: 16    Post-op Vital Signs: stable   Complications: No apparent anesthesia complications

## 2013-01-01 ENCOUNTER — Encounter (HOSPITAL_COMMUNITY): Payer: Self-pay | Admitting: Urology

## 2013-01-01 NOTE — Progress Notes (Signed)
Follow up phone call regarding update Pt now has allowed Foley Catheter to be placed and urine returned bright red no clots noted at this time. 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 Vernie Ammons was paged and made aware of Pt's wife concerns. Per MD orders foley irrigated one time with bloody urine returned but no clots and urine slowing easily. 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

## 2013-01-01 NOTE — Progress Notes (Signed)
Via phone update the Nurse regarding Pt's wife concerns and now Pt refusing insertions of cathter. "My wife does not want me to have that so I don't want it" Explained to Pt again need for catheter and he voices understanding . Awaiting phone call back from MD office

## 2013-01-01 NOTE — Progress Notes (Signed)
Order given by Dr. Vernie Ammons via the RN to reinsert foley catheter and discharge Pt to home this afternoon. RN went to Pt's room to explain and educate Pt regarding need for Foley Catheter reinsertion due to inability to void.and Pt states "I need to call my wife before that she will not want me to come home with a catheter. Spoke to Pt's wife via phone and explained the current situation and the need for reinsertion of the catheter. 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

## 2013-01-01 NOTE — Progress Notes (Signed)
Utilization review completed.  

## 2013-01-01 NOTE — Progress Notes (Signed)
Pt post foley removal early am and unable to void post removal. Bladder scanner done and scanned. MD offiice notified and wait new orders.

## 2013-01-01 NOTE — Progress Notes (Addendum)
Pt attempted to void again, sitting on the side of the bed and standing by the bed.  Pt was unsuccessful voiding.  Pt discussed the situation w/ his wife again.  Pt agreed to have foley catheter placed.  When foley was placed small blood clots were noted in the urine and the urine was noted to be maroon.  Dr. Margrett Rud nurse made aware and RN continuing to monitor.

## 2013-01-02 MED ORDER — ACETAMINOPHEN 500 MG PO TABS
1000.0000 mg | ORAL_TABLET | Freq: Once | ORAL | Status: AC
  Administered 2013-01-02: 1000 mg via ORAL
  Filled 2013-01-02: qty 2

## 2013-01-02 MED ORDER — DIPHENHYDRAMINE HCL 50 MG PO CAPS
ORAL_CAPSULE | ORAL | Status: AC
  Administered 2013-01-02: 50 mg
  Filled 2013-01-02: qty 1

## 2013-01-02 MED ORDER — DIPHENHYDRAMINE HCL 50 MG PO CAPS: 50.0000 mg | ORAL_CAPSULE | Freq: Four times a day (QID) | ORAL | Status: DC | PRN

## 2013-01-02 NOTE — Progress Notes (Signed)
78 year old male status post TURBT on 12/31/12. Patient was admitted postoperatively for CBI. His Foley catheter was removed on postoperative day #1, and the patient was unable to void. A Foley catheter was then replaced and approximately 450 cc of urine drained. According to the patient and the notes the urine was quite bloody with some clots. As such the patient was kept in the hospital for an additional day.  Today, postop day #2, the patient is without complaints. Urine in the Foley bag is a darker pink color with no appreciable clots. There was no significant issues overnight with the Foley catheter, no hand irrigation was required.  In addition to the patient's gross hematuria and urinary retention, the patient developed a rash on his forearms bilaterally. This was not evaluated by an M.D. at the time of its occurrence and there is no documentation of that. Today the rash is present on the patient's forearms, the patient states that the rash has subsided significantly. The rash is a non-pruritic macular rash this nonsymmetric but presently on the patient's forearms. Not clear to me at this is a drug rash or related to a contact dermatitis. However, it is getting better without treatment.   Filed Vitals:   01/02/13 1004  BP: 137/100  Pulse: 90  Temp:   Resp:     Intake/Output Summary (Last 24 hours) at 01/02/13 1129 Last data filed at 01/02/13 0900  Gross per 24 hour  Intake 1052.5 ml  Output   1520 ml  Net -467.5 ml   NAD Abdomen soft Foley draining dark pink urine without clot  No lab  Impression: Postop day #2 status post TURBT complicated by urinary retention and gross hematuria. This appears to have resolved with placement of a Foley catheter.  Patient's rash appears to be improving without treatment.  Plan: I discussed the idea of discharge with a Foley catheter and followup early next week for a voiding trial with the patient and his wife. I also mild for discharge today  with followup with Dr. Lorayne Bender early next week.

## 2013-01-02 NOTE — Progress Notes (Signed)
RN found patient in room scratching and feeling itching, cause is unknown but noted redness to bilateral posterior arms.  Also lower eyelid is more swollen than noted at start of shift.  Dr Linzie Collin made aware and order received.  Patient pulled out IV due to this itching and new IV started.

## 2013-01-02 NOTE — Discharge Summary (Addendum)
Date of admission: 12/31/2012  Date of discharge: 01/02/2013  Admission diagnosis: Bladder mass status post TURBT  Discharge diagnosis: As above, gross hematuria, urinary retention, rash  Secondary diagnoses:  Patient Active Problem List   Diagnosis Date Noted  . Bladder cancer 05/30/2010  . DIABETES MELLITUS, TYPE I, WITH RENAL COMPLICATIONS 10/26/2008  . TESTICULAR CANCER 05/28/2007  . HYPERLIPIDEMIA 05/28/2007  . GOUT 05/28/2007  . HYPERTENSION 05/28/2007  . PERIPHERAL VASCULAR DISEASE 05/28/2007  . RENAL INSUFFICIENCY 05/28/2007  . FRACTURE, FEMUR, RIGHT 05/28/2007    History and Physical: For full details, please see admission history and physical. Briefly, [name] is a 78 y.o. year old patient admitted after TURBT for lesions found within the patient's bladder during an office cystoscopy.   Hospital Course:  78 year old male status post TURBT on 12/31/12. Patient was admitted postoperatively for CBI. His Foley catheter was removed on postoperative day #1, and the patient was unable to void. A Foley catheter was then replaced and approximately 450 cc of urine drained. According to the patient and the notes the urine was quite bloody with some clots. As such the patient was kept in the hospital for an additional day.  Today, postop day #2, the patient is without complaints. Urine in the Foley bag is a darker pink color with no appreciable clots. There was no significant issues overnight with the Foley catheter, no hand irrigation was required.  In addition to the patient's gross hematuria and urinary retention, the patient developed a rash on his forearms bilaterally. This was not evaluated by an M.D. at the time of its occurrence and there is no documentation of that. Today the rash is present on the patient's forearms, the patient states that the rash has subsided significantly. The rash is a non-pruritic macular rash this nonsymmetric but presently on the patient's  forearms. Not clear to me at this is a drug rash or related to a contact dermatitis. However, it is getting better without treatment.  Laboratory values:  No results found for this basename: WBC, HGB, HCT,  in the last 72 hours No results found for this basename: NA, K, CL, CO2, GLUCOSE, BUN, CREATININE, CALCIUM,  in the last 72 hours No results found for this basename: LABPT, INR,  in the last 72 hours No results found for this basename: LABURIN,  in the last 72 hours Results for orders placed during the hospital encounter of 04/01/12  SURGICAL PCR SCREEN     Status: None   Collection Time    04/01/12 10:15 AM      Result Value Range Status   MRSA, PCR NEGATIVE  NEGATIVE Final   Staphylococcus aureus NEGATIVE  NEGATIVE Final   Comment:            The Xpert SA Assay (FDA     approved for NASAL specimens     in patients over 29 years of age),     is one component of     a comprehensive surveillance     program.  Test performance has     been validated by [employer] for patients greater     than or equal to 81 year old.     It is not intended     to diagnose infection nor to     guide or monitor treatment.    Disposition: Home  Discharge instruction: The patient was instructed to be ambulatory but told to refrain from heavy lifting, strenuous activity, or  driving.  Discharge medications:    Followup:  Follow-up Information   Follow up with Chelsea Aus, MD. (as scheduled)    Specialty:  Urology   Contact information:   [address] [place] [address] [phone]       Follow up with Chelsea Aus, MD. (as scheduled)    Specialty:  Urology   Contact information:   [address] [place] [address] [phone]       Follow up with Chelsea Aus, MD. Call in 3 days. (for voiding trial)    Specialty:  Urology   Contact information:   [address] [place]  [address] [phone]

## 2013-02-22 ENCOUNTER — Emergency Department (HOSPITAL_COMMUNITY): Payer: Medicare Other

## 2013-02-22 ENCOUNTER — Inpatient Hospital Stay (HOSPITAL_COMMUNITY)
Admission: EM | Admit: 2013-02-22 | Discharge: 2013-03-02 | DRG: 812 | Disposition: A | Discharge status: Skilled Nursing Facility | Payer: Medicare Other | Attending: Internal Medicine | Admitting: Internal Medicine

## 2013-02-22 ENCOUNTER — Encounter (HOSPITAL_COMMUNITY): Payer: Self-pay | Admitting: Emergency Medicine

## 2013-02-22 DIAGNOSIS — I10 Essential (primary) hypertension: Secondary | ICD-10-CM | POA: Diagnosis present

## 2013-02-22 DIAGNOSIS — C679 Malignant neoplasm of bladder, unspecified: Secondary | ICD-10-CM | POA: Diagnosis present

## 2013-02-22 DIAGNOSIS — E7800 Pure hypercholesterolemia, unspecified: Secondary | ICD-10-CM | POA: Diagnosis present

## 2013-02-22 DIAGNOSIS — Z66 Do not resuscitate: Secondary | ICD-10-CM | POA: Diagnosis present

## 2013-02-22 DIAGNOSIS — M109 Gout, unspecified: Secondary | ICD-10-CM | POA: Diagnosis present

## 2013-02-22 DIAGNOSIS — M1990 Unspecified osteoarthritis, unspecified site: Secondary | ICD-10-CM | POA: Diagnosis present

## 2013-02-22 DIAGNOSIS — Z794 Long term (current) use of insulin: Secondary | ICD-10-CM

## 2013-02-22 DIAGNOSIS — D62 Acute posthemorrhagic anemia: Secondary | ICD-10-CM | POA: Diagnosis present

## 2013-02-22 DIAGNOSIS — Z833 Family history of diabetes mellitus: Secondary | ICD-10-CM

## 2013-02-22 DIAGNOSIS — E872 Acidosis, unspecified: Secondary | ICD-10-CM | POA: Diagnosis present

## 2013-02-22 DIAGNOSIS — R7989 Other specified abnormal findings of blood chemistry: Secondary | ICD-10-CM

## 2013-02-22 DIAGNOSIS — E1029 Type 1 diabetes mellitus with other diabetic kidney complication: Secondary | ICD-10-CM | POA: Diagnosis present

## 2013-02-22 DIAGNOSIS — D500 Iron deficiency anemia secondary to blood loss (chronic): Secondary | ICD-10-CM | POA: Diagnosis present

## 2013-02-22 DIAGNOSIS — D649 Anemia, unspecified: Secondary | ICD-10-CM | POA: Diagnosis present

## 2013-02-22 DIAGNOSIS — N390 Urinary tract infection, site not specified: Secondary | ICD-10-CM | POA: Diagnosis present

## 2013-02-22 DIAGNOSIS — N179 Acute kidney failure, unspecified: Secondary | ICD-10-CM

## 2013-02-22 DIAGNOSIS — Z87891 Personal history of nicotine dependence: Secondary | ICD-10-CM

## 2013-02-22 DIAGNOSIS — R310 Gross hematuria: Secondary | ICD-10-CM | POA: Diagnosis present

## 2013-02-22 DIAGNOSIS — M810 Age-related osteoporosis without current pathological fracture: Secondary | ICD-10-CM | POA: Diagnosis present

## 2013-02-22 DIAGNOSIS — N1330 Unspecified hydronephrosis: Secondary | ICD-10-CM | POA: Diagnosis present

## 2013-02-22 DIAGNOSIS — N058 Unspecified nephritic syndrome with other morphologic changes: Secondary | ICD-10-CM | POA: Diagnosis present

## 2013-02-22 DIAGNOSIS — Z9079 Acquired absence of other genital organ(s): Secondary | ICD-10-CM

## 2013-02-22 DIAGNOSIS — Z7982 Long term (current) use of aspirin: Secondary | ICD-10-CM

## 2013-02-22 DIAGNOSIS — M216X9 Other acquired deformities of unspecified foot: Secondary | ICD-10-CM | POA: Diagnosis present

## 2013-02-22 DIAGNOSIS — M4804 Spinal stenosis, thoracic region: Secondary | ICD-10-CM | POA: Diagnosis present

## 2013-02-22 DIAGNOSIS — E875 Hyperkalemia: Secondary | ICD-10-CM | POA: Diagnosis present

## 2013-02-22 DIAGNOSIS — R64 Cachexia: Secondary | ICD-10-CM | POA: Diagnosis present

## 2013-02-22 DIAGNOSIS — E109 Type 1 diabetes mellitus without complications: Secondary | ICD-10-CM | POA: Diagnosis present

## 2013-02-22 DIAGNOSIS — E785 Hyperlipidemia, unspecified: Secondary | ICD-10-CM | POA: Diagnosis present

## 2013-02-22 DIAGNOSIS — M48061 Spinal stenosis, lumbar region without neurogenic claudication: Secondary | ICD-10-CM | POA: Diagnosis present

## 2013-02-22 DIAGNOSIS — R319 Hematuria, unspecified: Secondary | ICD-10-CM

## 2013-02-22 DIAGNOSIS — Z8547 Personal history of malignant neoplasm of testis: Secondary | ICD-10-CM

## 2013-02-22 DIAGNOSIS — N184 Chronic kidney disease, stage 4 (severe): Secondary | ICD-10-CM | POA: Diagnosis present

## 2013-02-22 DIAGNOSIS — I509 Heart failure, unspecified: Secondary | ICD-10-CM | POA: Diagnosis present

## 2013-02-22 DIAGNOSIS — I129 Hypertensive chronic kidney disease with stage 1 through stage 4 chronic kidney disease, or unspecified chronic kidney disease: Secondary | ICD-10-CM | POA: Diagnosis present

## 2013-02-22 DIAGNOSIS — Z79899 Other long term (current) drug therapy: Secondary | ICD-10-CM

## 2013-02-22 DIAGNOSIS — E119 Type 2 diabetes mellitus without complications: Secondary | ICD-10-CM | POA: Diagnosis present

## 2013-02-22 DIAGNOSIS — R32 Unspecified urinary incontinence: Secondary | ICD-10-CM | POA: Diagnosis present

## 2013-02-22 LAB — COMPREHENSIVE METABOLIC PANEL
ALT: 38 U/L (ref 0–53)
Albumin: 3 g/dL — ABNORMAL LOW (ref 3.5–5.2)
Alkaline Phosphatase: 73 U/L (ref 39–117)
BUN: 109 mg/dL — ABNORMAL HIGH (ref 6–23)
Calcium: 9 mg/dL (ref 8.4–10.5)
Chloride: 110 mEq/L (ref 96–112)
Creatinine, Ser: 2.99 mg/dL — ABNORMAL HIGH (ref 0.50–1.35)
GFR calc Af Amer: 22 mL/min — ABNORMAL LOW (ref >90)
GFR calc non Af Amer: 19 mL/min — ABNORMAL LOW (ref >90)
Glucose, Bld: 167 mg/dL — ABNORMAL HIGH (ref 70–99)
Potassium: 5.2 mEq/L — ABNORMAL HIGH (ref 3.5–5.1)
Total Bilirubin: 0.3 mg/dL (ref 0.3–1.2)

## 2013-02-22 LAB — PROTIME-INR
INR: 1.16 (ref 0.00–1.49)
Prothrombin Time: 14.6 seconds (ref 11.6–15.2)

## 2013-02-22 LAB — URINE MICROSCOPIC-ADD ON

## 2013-02-22 LAB — CBC WITH DIFFERENTIAL/PLATELET
Basophils Relative: 0 % (ref 0–1)
Eosinophils Relative: 1 % (ref 0–5)
Hemoglobin: 8 g/dL — ABNORMAL LOW (ref 13.0–17.0)
Lymphocytes Relative: 11 % — ABNORMAL LOW (ref 12–46)
Monocytes Relative: 11 % (ref 3–12)
Neutro Abs: 4.8 10*3/uL (ref 1.7–7.7)
Neutrophils Relative %: 77 % (ref 43–77)
RBC: 2.52 MIL/uL — ABNORMAL LOW (ref 4.22–5.81)
WBC: 6.3 10*3/uL (ref 4.0–10.5)

## 2013-02-22 LAB — GLUCOSE, CAPILLARY: Glucose-Capillary: 94 mg/dL (ref 70–99)

## 2013-02-22 LAB — APTT: aPTT: 35 seconds (ref 24–37)

## 2013-02-22 LAB — URINALYSIS, ROUTINE W REFLEX MICROSCOPIC
Glucose, UA: NEGATIVE mg/dL
Specific Gravity, Urine: 1.013 (ref 1.005–1.030)
Urobilinogen, UA: 0.2 mg/dL (ref 0.0–1.0)
pH: 5.5 (ref 5.0–8.0)

## 2013-02-22 LAB — PRO B NATRIURETIC PEPTIDE: Pro B Natriuretic peptide (BNP): 5556 pg/mL — ABNORMAL HIGH (ref 0–450)

## 2013-02-22 LAB — RETICULOCYTES: Retic Ct Pct: 1.6 % (ref 0.4–3.1)

## 2013-02-22 LAB — OCCULT BLOOD, POC DEVICE: Fecal Occult Bld: NEGATIVE

## 2013-02-22 MED ORDER — INSULIN ASPART 100 UNIT/ML ~~LOC~~ SOLN
0.0000 [IU] | Freq: Every day | SUBCUTANEOUS | Status: DC
  Administered 2013-02-27: 22:00:00 3 [IU] via SUBCUTANEOUS

## 2013-02-22 MED ORDER — SODIUM BICARBONATE 650 MG PO TABS
650.0000 mg | ORAL_TABLET | Freq: Two times a day (BID) | ORAL | Status: DC
  Administered 2013-02-23 – 2013-03-02 (×16): 650 mg via ORAL
  Filled 2013-02-22 (×17): qty 1

## 2013-02-22 MED ORDER — SODIUM CHLORIDE 0.9 % IV SOLN: 250.0000 mL | INTRAVENOUS | Status: DC | PRN

## 2013-02-22 MED ORDER — SODIUM CHLORIDE 0.9 % IJ SOLN
3.0000 mL | Freq: Two times a day (BID) | INTRAMUSCULAR | Status: DC
  Administered 2013-02-23 (×2): 3 mL via INTRAVENOUS

## 2013-02-22 MED ORDER — IPRATROPIUM-ALBUTEROL 0.5-2.5 (3) MG/3ML IN SOLN: 3.0000 mL | RESPIRATORY_TRACT | Status: DC | PRN

## 2013-02-22 MED ORDER — ONDANSETRON HCL 4 MG PO TABS: 4.0000 mg | ORAL_TABLET | Freq: Four times a day (QID) | ORAL | Status: DC | PRN

## 2013-02-22 MED ORDER — ALUM & MAG HYDROXIDE-SIMETH 200-200-20 MG/5ML PO SUSP: 30.0000 mL | Freq: Four times a day (QID) | ORAL | Status: DC | PRN

## 2013-02-22 MED ORDER — ACETAMINOPHEN 650 MG RE SUPP: 650.0000 mg | Freq: Four times a day (QID) | RECTAL | Status: DC | PRN

## 2013-02-22 MED ORDER — SODIUM POLYSTYRENE SULFONATE 15 GM/60ML PO SUSP
15.0000 g | Freq: Once | ORAL | Status: AC
  Administered 2013-02-23: 15 g via ORAL
  Filled 2013-02-22: qty 60

## 2013-02-22 MED ORDER — METOPROLOL SUCCINATE ER 100 MG PO TB24
100.0000 mg | ORAL_TABLET | Freq: Every day | ORAL | Status: DC
  Administered 2013-02-23 – 2013-03-02 (×8): 100 mg via ORAL
  Filled 2013-02-22 (×8): qty 1

## 2013-02-22 MED ORDER — SIMVASTATIN 20 MG PO TABS
20.0000 mg | ORAL_TABLET | Freq: Every day | ORAL | Status: DC
  Administered 2013-02-23 – 2013-03-01 (×7): 20 mg via ORAL
  Filled 2013-02-22 (×8): qty 1

## 2013-02-22 MED ORDER — SODIUM CHLORIDE 0.9 % IJ SOLN: 3.0000 mL | Freq: Two times a day (BID) | INTRAMUSCULAR | Status: DC

## 2013-02-22 MED ORDER — ALLOPURINOL 100 MG PO TABS
100.0000 mg | ORAL_TABLET | Freq: Every day | ORAL | Status: DC
  Administered 2013-02-23 – 2013-03-02 (×8): 100 mg via ORAL
  Filled 2013-02-22 (×8): qty 1

## 2013-02-22 MED ORDER — HYDROCODONE-ACETAMINOPHEN 5-325 MG PO TABS: 1.0000 | ORAL_TABLET | ORAL | Status: DC | PRN

## 2013-02-22 MED ORDER — INSULIN GLARGINE 100 UNIT/ML ~~LOC~~ SOLN
9.0000 [IU] | Freq: Every day | SUBCUTANEOUS | Status: DC
  Administered 2013-02-23 – 2013-03-02 (×8): 9 [IU] via SUBCUTANEOUS
  Filled 2013-02-22 (×8): qty 0.09

## 2013-02-22 MED ORDER — BENZONATATE 100 MG PO CAPS
100.0000 mg | ORAL_CAPSULE | Freq: Three times a day (TID) | ORAL | Status: DC | PRN
  Filled 2013-02-22: qty 1

## 2013-02-22 MED ORDER — AMLODIPINE BESYLATE 10 MG PO TABS
10.0000 mg | ORAL_TABLET | Freq: Every day | ORAL | Status: DC
  Administered 2013-02-23 – 2013-03-02 (×8): 10 mg via ORAL
  Filled 2013-02-22 (×9): qty 1

## 2013-02-22 MED ORDER — HYDRALAZINE HCL 25 MG PO TABS
25.0000 mg | ORAL_TABLET | Freq: Three times a day (TID) | ORAL | Status: DC
  Administered 2013-02-23 – 2013-03-02 (×23): 25 mg via ORAL
  Filled 2013-02-22 (×25): qty 1

## 2013-02-22 MED ORDER — INSULIN ASPART 100 UNIT/ML ~~LOC~~ SOLN
0.0000 [IU] | Freq: Three times a day (TID) | SUBCUTANEOUS | Status: DC
  Administered 2013-02-23: 08:00:00 2 [IU] via SUBCUTANEOUS
  Administered 2013-02-23 – 2013-02-24 (×2): 3 [IU] via SUBCUTANEOUS
  Administered 2013-02-24: 2 [IU] via SUBCUTANEOUS
  Administered 2013-02-25: 3 [IU] via SUBCUTANEOUS
  Administered 2013-02-25: 2 [IU] via SUBCUTANEOUS
  Administered 2013-02-26: 8 [IU] via SUBCUTANEOUS
  Administered 2013-02-26: 2 [IU] via SUBCUTANEOUS
  Administered 2013-02-27 (×2): 3 [IU] via SUBCUTANEOUS
  Administered 2013-02-28: 2 [IU] via SUBCUTANEOUS
  Administered 2013-02-28 – 2013-03-01 (×2): 3 [IU] via SUBCUTANEOUS
  Administered 2013-03-01: 2 [IU] via SUBCUTANEOUS
  Administered 2013-03-01: 5 [IU] via SUBCUTANEOUS
  Administered 2013-03-02: 13:00:00 3 [IU] via SUBCUTANEOUS
  Administered 2013-03-02: 5 [IU] via SUBCUTANEOUS

## 2013-02-22 MED ORDER — ONDANSETRON HCL 4 MG/2ML IJ SOLN: 4.0000 mg | Freq: Four times a day (QID) | INTRAMUSCULAR | Status: DC | PRN

## 2013-02-22 MED ORDER — VITAMIN B-12 1000 MCG PO TABS
1000.0000 ug | ORAL_TABLET | Freq: Every day | ORAL | Status: DC
  Administered 2013-02-23 – 2013-03-02 (×8): 1000 ug via ORAL
  Filled 2013-02-22 (×8): qty 1

## 2013-02-22 MED ORDER — ACETAMINOPHEN 325 MG PO TABS: 650.0000 mg | ORAL_TABLET | Freq: Four times a day (QID) | ORAL | Status: DC | PRN

## 2013-02-22 MED ORDER — SENNA 8.6 MG PO TABS
1.0000 | ORAL_TABLET | Freq: Two times a day (BID) | ORAL | Status: DC
  Administered 2013-02-23 – 2013-03-02 (×7): 8.6 mg via ORAL
  Filled 2013-02-22 (×11): qty 1

## 2013-02-22 MED ORDER — FUROSEMIDE 10 MG/ML IJ SOLN
40.0000 mg | Freq: Two times a day (BID) | INTRAMUSCULAR | Status: DC
  Administered 2013-02-23 – 2013-02-26 (×9): 40 mg via INTRAVENOUS
  Filled 2013-02-22 (×12): qty 4

## 2013-02-22 MED ORDER — TAMSULOSIN HCL 0.4 MG PO CAPS
0.4000 mg | ORAL_CAPSULE | Freq: Every day | ORAL | Status: DC
  Administered 2013-02-23 – 2013-03-02 (×8): 0.4 mg via ORAL
  Filled 2013-02-22 (×8): qty 1

## 2013-02-22 MED ORDER — SODIUM CHLORIDE 0.9 % IJ SOLN: 3.0000 mL | INTRAMUSCULAR | Status: DC | PRN

## 2013-02-22 NOTE — ED Notes (Signed)
Bed: WA11 Expected date:  Expected time:  Means of arrival:  Comments: EMS 

## 2013-02-22 NOTE — ED Notes (Signed)
Patient transported to MRI 

## 2013-02-22 NOTE — ED Notes (Signed)
Per EMS-pt hx of bladder cancer c/o of increased weakness for the past couple of days and blood in urine. AAO x4, denies pain.

## 2013-02-22 NOTE — ED Notes (Signed)
Assumed care of patient PIV to right wrist noted to be infiltrated with large hematoma PIV site immediately removed--tip intact Pressure and hot pack applied to site New PIV site placed in the left hand  Patient denied further needs or complaints at this time MD at bedside

## 2013-02-22 NOTE — ED Notes (Signed)
Hourly rounding reported by C. Lawerance Cruel was entered on wrong patient.

## 2013-02-22 NOTE — ED Provider Notes (Signed)
CSN: [id number]     Arrival date & time 02/22/13  1553 History   First MD Initiated Contact with Patient 02/22/13 1559     Chief Complaint  Patient presents with  . Weakness  . Hematuria   (Consider location/radiation/quality/duration/timing/severity/associated sxs/prior Treatment) Patient is a 78 y.o. male presenting with weakness and hematuria.  Weakness Associated symptoms include weakness. Pertinent negatives include no abdominal pain, chest pain, headaches, myalgias, nausea, numbness or vomiting.  Hematuria Associated symptoms include weakness. Pertinent negatives include no abdominal pain, chest pain, headaches, myalgias, nausea, numbness or vomiting.   78 yo male presents c/o hematuria and weakness.  PMH significant for bladder cancer, testicular cancer, and recent bladder surgery less than 2 months ago. urinary/bowel incontinence.Patient now having incontinence and a new Dyspnea with exertion. Patient denies any CP, Dizziness, Palpitations, hx of blood clots, anticoagulant use. Denies fever/chills. Admits to chronic bilateral tremor that is progressively worsening. Patient states his mother had Parkinson's, but he has never been dx with it.  Past Medical History  Diagnosis Date  . Hypertension   . Heart murmur   . Gout     no attack in years  . Chronic kidney disease     RENAL INSUFFICIENCY  . Diabetes mellitus   . Bladder cancer   . Testicular cancer     1965  . Hypercholesteremia   . Osteoporosis   . Right foot drop    Past Surgical History  Procedure Laterality Date  . Orchiectomy  1965    right  . Turbt    . Fx femur  2006  . Cystoscopy w/ retrogrades  02/15/2011    Procedure: CYSTOSCOPY WITH RETROGRADE PYELOGRAM;  Surgeon: Marcine Matar, MD;  Location: [facility];  Service: Urology;  Laterality: Left;  Cystoscopy, Transurethral Resection of Bladder Tumor, Left Retrograde Pyelograms    . Transurethral resection of bladder tumor  02/15/2011    Procedure:  TRANSURETHRAL RESECTION OF BLADDER TUMOR (TURBT);  Surgeon: Marcine Matar, MD;  Location: [facility];  Service: Urology;  Laterality: N/A;  . Cystoscopy w/ retrogrades Bilateral 04/06/2012    Procedure: CYSTOSCOPY WITH RETROGRADE PYELOGRAM;  Surgeon: Marcine Matar, MD;  Location: [facility];  Service: Urology;  Laterality: Bilateral;  CYSTO, TURBT, BILATERAL RETROGRADES AND INSTILLATION Mitomycin C  . Transurethral resection of bladder tumor N/A 04/06/2012    Procedure: TRANSURETHRAL RESECTION OF BLADDER TUMOR (TURBT);  Surgeon: Marcine Matar, MD;  Location: [facility];  Service: Urology;  Laterality: N/A;  . Tonsillectomy  as child  . Cataract extraction Right   . Transurethral resection of bladder tumor with gyrus (turbt-gyrus) N/A 12/31/2012    Procedure: TRANSURETHRAL RESECTION OF BLADDER TUMOR WITH GYRUS (TURBT-GYRUS);  Surgeon: Marcine Matar, MD;  Location: [facility];  Service: Urology;  Laterality: N/A;  . Transurethral resection of prostate N/A 12/31/2012    Procedure: TRANSURETHRAL RESECTION OF THE PROSTATE WITH GYRUS INSTRUMENTS;  Surgeon: Marcine Matar, MD;  Location: [facility];  Service: Urology;  Laterality: N/A;   Family History  Problem Relation Age of Onset  . Diabetes Father   . Diabetes Sister    History  Substance Use Topics  . Smoking status: Former Smoker    Types: Cigarettes    Quit date: 02/26/1968  . Smokeless tobacco: Never Used  . Alcohol Use: No    Review of Systems  Respiratory: Positive for shortness of breath.   Cardiovascular: Positive for leg swelling. Negative for chest pain.  Gastrointestinal: Negative for nausea, vomiting, abdominal pain, diarrhea, constipation, blood in stool,  abdominal distention, anal bleeding and rectal pain.  Genitourinary: Positive for hematuria. Negative for dysuria, scrotal swelling, difficulty urinating, penile pain and testicular pain.  Musculoskeletal: Negative for back pain and myalgias.  Neurological: Positive for  weakness. Negative for dizziness, syncope, speech difficulty, numbness and headaches.  Psychiatric/Behavioral: Negative for behavioral problems and confusion.     Allergies  Review of patient's allergies indicates no known allergies.  Home Medications   No current outpatient prescriptions on file. BP 158/64  Pulse 73  Temp(Src) 98.8 F (37.1 C) (Oral)  Resp 24  Ht 5\' 11"  (1.803 m)  Wt 147 lb 14.9 oz (67.1 kg)  BMI 20.64 kg/m2  SpO2 99% Physical Exam  Nursing note and vitals reviewed. Constitutional: He is oriented to person, place, and time. He appears well-developed and well-nourished. No distress.  HENT:  Head: Normocephalic and atraumatic.  Eyes: Conjunctivae and EOM are normal. Pupils are equal, round, and reactive to light. No scleral icterus.  Neck: Normal range of motion. Neck supple. No JVD present.  Cardiovascular: Normal rate and regular rhythm.  Exam reveals no gallop and no friction rub.   No murmur heard. Pulmonary/Chest: Effort normal and breath sounds normal. No respiratory distress. He has no wheezes. He has no rales.  Abdominal: Soft. Bowel sounds are normal. He exhibits no distension and no mass. There is no tenderness. There is no rebound and no guarding.  Genitourinary: Rectal exam shows anal tone abnormal (Poor rectal tone). Rectal exam shows no external hemorrhoid, no internal hemorrhoid, no fissure, no mass and no tenderness. Guaiac negative stool. Left testis shows no mass, no swelling and no tenderness. Left testis is descended. No penile erythema or penile tenderness. Discharge (patient leaking blood tinged urine. ) found.  RIGHT testicle absent (hx of surgical removal secondary to cancer)   Musculoskeletal: Normal range of motion. He exhibits no edema.  Neurological: He is alert and oriented to person, place, and time.  Reflex Scores:      Bicep reflexes are 1+ on the right side and 1+ on the left side.      Patellar reflexes are 1+ on the right side  and 1+ on the left side. RIGHT Sided foot drop.      Skin: Skin is warm and dry. He is not diaphoretic.  Psychiatric: He has a normal mood and affect. His behavior is normal.    ED Course  Procedures (including critical care time) Labs Review Labs Reviewed  CBC WITH DIFFERENTIAL - Abnormal; Notable for the following:    RBC 2.52 (*)    Hemoglobin 8.0 (*)    HCT 23.8 (*)    Platelets 94 (*)    Lymphocytes Relative 11 (*)    All other components within normal limits  URINALYSIS, ROUTINE W REFLEX MICROSCOPIC - Abnormal; Notable for the following:    Color, Urine RED (*)    APPearance CLOUDY (*)    Hgb urine dipstick LARGE (*)    Bilirubin Urine MODERATE (*)    Protein, ur 100 (*)    Nitrite POSITIVE (*)    Leukocytes, UA LARGE (*)    All other components within normal limits  COMPREHENSIVE METABOLIC PANEL - Abnormal; Notable for the following:    Potassium 5.2 (*)    CO2 16 (*)    Glucose, Bld 167 (*)    BUN 109 (*)    Creatinine, Ser 2.99 (*)    Total Protein 5.9 (*)    Albumin 3.0 (*)    AST 42 (*)  GFR calc non Af Amer 19 (*)    GFR calc Af Amer 22 (*)    All other components within normal limits  PRO B NATRIURETIC PEPTIDE - Abnormal; Notable for the following:    Pro B Natriuretic peptide (BNP) 5556.0 (*)    All other components within normal limits  URINE MICROSCOPIC-ADD ON - Abnormal; Notable for the following:    Bacteria, UA FEW (*)    Casts HYALINE CASTS (*)    All other components within normal limits  IRON AND TIBC - Abnormal; Notable for the following:    Iron <10 (*)    All other components within normal limits  PRO B NATRIURETIC PEPTIDE - Abnormal; Notable for the following:    Pro B Natriuretic peptide (BNP) 5539.0 (*)    All other components within normal limits  HEMOGLOBIN AND HEMATOCRIT, BLOOD - Abnormal; Notable for the following:    Hemoglobin 9.1 (*)    HCT 26.9 (*)    All other components within normal limits  BASIC METABOLIC PANEL -  Abnormal; Notable for the following:    Glucose, Bld 199 (*)    BUN 99 (*)    Creatinine, Ser 2.84 (*)    GFR calc non Af Amer 20 (*)    GFR calc Af Amer 23 (*)    All other components within normal limits  CBC - Abnormal; Notable for the following:    RBC 3.26 (*)    Hemoglobin 10.0 (*)    HCT 30.0 (*)    Platelets 102 (*)    All other components within normal limits  RETICULOCYTES - Abnormal; Notable for the following:    RBC. 2.87 (*)    All other components within normal limits  HEMOGLOBIN AND HEMATOCRIT, BLOOD - Abnormal; Notable for the following:    Hemoglobin 10.1 (*)    HCT 30.1 (*)    All other components within normal limits  GLUCOSE, CAPILLARY - Abnormal; Notable for the following:    Glucose-Capillary 128 (*)    All other components within normal limits  GLUCOSE, CAPILLARY - Abnormal; Notable for the following:    Glucose-Capillary 115 (*)    All other components within normal limits  GLUCOSE, CAPILLARY - Abnormal; Notable for the following:    Glucose-Capillary 163 (*)    All other components within normal limits  URINE CULTURE  APTT  PROTIME-INR  FERRITIN  ERYTHROPOIETIN  GLUCOSE, CAPILLARY  PROTIME-INR  SAVE SMEAR  GLUCOSE, CAPILLARY  HEMOGLOBIN AND HEMATOCRIT, BLOOD  HEMOGLOBIN AND HEMATOCRIT, BLOOD  OCCULT BLOOD, POC DEVICE  TYPE AND SCREEN  PREPARE RBC (CROSSMATCH)  ABO/RH   Imaging Review Dg Chest 2 View  02/22/2013   CLINICAL DATA:  Weakness, cough, hematuria  EXAM: CHEST  2 VIEW  COMPARISON:  04/01/2012  FINDINGS: Low lung volumes. There is diffuse prominence of the interstitial markings particularly in the perihilar regions. There are areas peribronchial cuffing. Focal area of consolidated density projects within the retrocardiac region on the left. The cardiac silhouette is within normal limits. The osseous structures are unremarkable.  IMPRESSION: Atelectasis versus consolidative infiltrate left lung base. A component of the effusion is also a  diagnostic consideration. Interstitial infiltrate differential considerations are pulmonary edema versus an infectious or inflammatory infiltrate. Underlying component of pulmonary fibrosis is also a diagnostic consideration.   Electronically Signed   By: Salome Holmes M.D.   On: 02/22/2013 17:59   Mr Lumbar Spine Wo Contrast  02/22/2013   CLINICAL DATA:  Urinary/bowel incontinence.  Poor rectal tone. History bladder cancer.  EXAM: MRI LUMBAR SPINE WITHOUT CONTRAST  TECHNIQUE: Multiplanar, multisequence MR imaging was performed. No intravenous contrast was administered.  COMPARISON:  None.  FINDINGS: New right hydroureteronephrosis, with ureteral dilatation extending below the level of imaging, likely to the bladder given previous findings. There is retroperitoneal perivertebral edematous on STIR imaging, likely reactive. Trabeculated bladder from chronic outlet obstruction. No definitive left-sided hydronephrosis, although poorly evaluated. There is urine in the bladder, arguing against complete bilateral obstruction.  No evidence of acute fracture, spine infection, or definitive metastasis. The marrow is heterogeneous, but without focal masslike abnormality. Heterogeneity is often from chronic hypoxemia or anemia. Normal conus signal and morphology.  Congenitally narrow spinal canal with diffuse, advanced degenerative changes superimposed.  L1-L2: Disc bulging and facet/ ligament overgrowth, narrowing the AP diameter of the canal.  L2-L3: Advanced degenerative disc narrowing with posterior disc bulging and dorsal ligamentous and facet overgrowth resulting in triangular-shaped thecal sac, effacing much of the CSF. Superimposed left foraminal disc herniation causes asymmetric left foraminal stenosis.  L3-L4: Degenerative facet and ligament overgrowth. Facet spurring and leftward disc bulging narrows the left more than right foramen.  L4-L5: Advanced canal stenosis secondary to marked dorsal ligamentous  thickening, facet overgrowth, and broad disc bulging. There is complete effacement of CSF. Disc and facet spurs narrow the inferior foramen bilaterally.  L5-S1:Advanced right foraminal narrowing, with no visible perineural fat, secondary to disc narrowing, disc bulging, and facet spurring.  IMPRESSION: 1. Right hydroureteronephrosis which has developed since 04/24/2012. 2. Advanced spinal canal stenosis at L4-5, secondary to congenitally narrow canal and superimposed degenerative disc and facet disease. 3. Diffuse degenerative disc and facet disease, with related stenoses described above. 4. Marrow heterogeneity, but no definitive spinal metastasis.   Electronically Signed   By: Tiburcio Pea M.D.   On: 02/22/2013 19:05    EKG Interpretation    Date/Time:    Ventricular Rate:    PR Interval:    QRS Duration:   QT Interval:    QTC Calculation:   R Axis:     Text Interpretation:              MDM   1. Hematuria   2. Acute blood loss anemia   3. Bladder cancer   4. Hydronephrosis, right   5. Unspecified essential hypertension    UA shows gross blood and consistent with Nitrite positive UTI. Labs show anemia, suspect causing patient's weakness. Hyperkalemia. BNP elevated.  CT shows RIGHT hydroureteronephrosis, spinal canal stenosis at L4-L5. Diffuse DDD. Marrow heterogeneity, though not specific for spinal metastasis.   CXR shows infiltrate vs atelectasis vs effusion at left lung base. Suggests Pulmonary edema vs infectious infiltrate. Suspect likely pulmonary edema given patients hx and labs.   Patient discussed with Dr. Nelva Nay. Plan to admit patient.    Rudene Anda, PA-C 02/23/13 [phone]

## 2013-02-22 NOTE — H&P (Addendum)
PCP:   Neldon Labella, MD  Oncology: Urology: Dr Dhalsteddt  Nephrology: Dr. Gasper Lloyd   Chief Complaint:  Blood in urine  HPI: This is a 78 year old gentleman with history of bladder cancer treated in November. Approximately 3 days ago he developed blood in his urine. He states initially he had bright red blood, that since yesterday has   improved thin red urine. Today he also developed shortness of breath with minimal ambulation. He states he does reck off for approximately week. He denies any wheezing, chest pains or palpitation. He does have chronic lower extremity edema which he states is worse. He is on Lasix at home he has some chronic kidney disease. he states he was unable to call his urologist because it was a holiday weekend. He had a colonoscopy 2 years ago which he states is normal.   Rectal tone check by ER physician was thought to be a little bit decreased therefore MRI of spine was ordered   Review of Systems:  The patient denies anorexia, fever, weight loss,, vision loss, decreased hearing, hoarseness, chest pain, syncope, dyspnea on exertion, peripheral edema, balance deficits, hemoptysis, abdominal pain, melena, hematochezia, severe indigestion/heartburn, hematuria, incontinence, genital sores, muscle weakness, suspicious skin lesions, transient blindness, difficulty walking, depression, unusual weight change, abnormal bleeding, enlarged lymph nodes, angioedema, and breast masses.  Past Medical History: Past Medical History  Diagnosis Date  . Hypertension   . Heart murmur   . Gout     no attack in years  . Chronic kidney disease     RENAL INSUFFICIENCY  . Diabetes mellitus   . Bladder cancer   . Testicular cancer     1965  . Hypercholesteremia   . Osteoporosis   . Right foot drop    Past Surgical History  Procedure Laterality Date  . Orchiectomy  1965    right  . Turbt    . Fx femur  2006  . Cystoscopy w/ retrogrades  02/15/2011    Procedure:  CYSTOSCOPY WITH RETROGRADE PYELOGRAM;  Surgeon: Marcine Matar, MD;  Location: [facility];  Service: Urology;  Laterality: Left;  Cystoscopy, Transurethral Resection of Bladder Tumor, Left Retrograde Pyelograms    . Transurethral resection of bladder tumor  02/15/2011    Procedure: TRANSURETHRAL RESECTION OF BLADDER TUMOR (TURBT);  Surgeon: Marcine Matar, MD;  Location: [facility];  Service: Urology;  Laterality: N/A;  . Cystoscopy w/ retrogrades Bilateral 04/06/2012    Procedure: CYSTOSCOPY WITH RETROGRADE PYELOGRAM;  Surgeon: Marcine Matar, MD;  Location: [facility];  Service: Urology;  Laterality: Bilateral;  CYSTO, TURBT, BILATERAL RETROGRADES AND INSTILLATION Mitomycin C  . Transurethral resection of bladder tumor N/A 04/06/2012    Procedure: TRANSURETHRAL RESECTION OF BLADDER TUMOR (TURBT);  Surgeon: Marcine Matar, MD;  Location: [facility];  Service: Urology;  Laterality: N/A;  . Tonsillectomy  as child  . Cataract extraction Right   . Transurethral resection of bladder tumor with gyrus (turbt-gyrus) N/A 12/31/2012    Procedure: TRANSURETHRAL RESECTION OF BLADDER TUMOR WITH GYRUS (TURBT-GYRUS);  Surgeon: Marcine Matar, MD;  Location: [facility];  Service: Urology;  Laterality: N/A;  . Transurethral resection of prostate N/A 12/31/2012    Procedure: TRANSURETHRAL RESECTION OF THE PROSTATE WITH GYRUS INSTRUMENTS;  Surgeon: Marcine Matar, MD;  Location: [facility];  Service: Urology;  Laterality: N/A;    Medications: Prior to Admission medications   Medication Sig Start Date End Date Taking? Authorizing Provider  allopurinol (ZYLOPRIM) 100 MG tablet Take 100 mg by mouth daily.  Yes Historical Provider, MD  amLODipine (NORVASC) 10 MG tablet Take 10 mg by mouth daily.   Yes Historical Provider, MD  aspirin 81 MG tablet Take 81 mg by mouth daily.   Yes Historical Provider, MD  calcium-vitamin D (OSCAL WITH D) 500-200 MG-UNIT per tablet Take 1 tablet by mouth daily with breakfast.   Yes  Historical Provider, MD  cholecalciferol (VITAMIN D) 1000 UNITS tablet Take 1,000 Units by mouth daily.   Yes Historical Provider, MD  furosemide (LASIX) 40 MG tablet Take 40 mg by mouth daily.    Yes Historical Provider, MD  hydrALAZINE (APRESOLINE) 25 MG tablet Take 25 mg by mouth 3 (three) times daily.    Yes Historical Provider, MD  insulin glargine (LANTUS) 100 UNIT/ML injection Inject 9 Units into the skin daily.   Yes Historical Provider, MD  insulin lispro (HUMALOG) 100 UNIT/ML injection Inject 3-4 Units into the skin 3 (three) times daily before meals. 3 units with breakfast 3 units with lunch and 4 units at supper   Yes Historical Provider, MD  metoprolol succinate (TOPROL-XL) 100 MG 24 hr tablet Take 100 mg by mouth daily. Take with or immediately following a meal.   Yes Historical Provider, MD  simvastatin (ZOCOR) 20 MG tablet Take 20 mg by mouth every morning.    Yes Historical Provider, MD  tamsulosin (FLOMAX) 0.4 MG CAPS capsule Take 0.4 mg by mouth daily.  02/16/13  Yes Historical Provider, MD  vitamin B-12 (CYANOCOBALAMIN) 1000 MCG tablet Take 1,000 mcg by mouth daily.   Yes Historical Provider, MD    Allergies:  No Known Allergies  Social History:  reports that he quit smoking about 45 years ago. His smoking use included Cigarettes. He smoked 0.00 packs per day. He has never used smokeless tobacco. He reports that he does not drink alcohol or use illicit drugs.  Family History: Family History  Problem Relation Age of Onset  . Diabetes Father   . Diabetes Sister     Physical Exam: Filed Vitals:   02/22/13 1608 02/22/13 1906  BP: 155/57 156/60  Pulse: 69 56  Temp: 98.9 F (37.2 C)   TempSrc: Oral   Resp: 18 20  SpO2: 98% 96%    General:  Alert and oriented times three, well developed and nourished, no acute distress Eyes: PERRLA, pink conjunctiva, no scleral icterus ENT: Moist oral mucosa, neck supple, no thyromegaly Lungs:  mild generalized wheeze, no  crackles, no use of accessory muscles Cardiovascular: regular rate and rhythm, no regurgitation, no gallops, no murmurs. No carotid bruits, no JVD Abdomen: soft, positive BS, non-tender, non-distended, no organomegaly, not an acute abdomen GU: not examined Neuro: CN II - XII grossly intact, sensation intact Musculoskeletal: strength 5/5 all extremities, no clubbing,  greater than 3+ bilateral lower extremity pitting  edema Skin: no rash, no subcutaneous crepitation, no decubitus Psych: appropriate patient   Labs on Admission:   Recent Labs  02/22/13 1722  NA 138  K 5.2*  CL 110  CO2 16*  GLUCOSE 167*  BUN 109*  CREATININE 2.99*  CALCIUM 9.0    Recent Labs  02/22/13 1722  AST 42*  ALT 38  ALKPHOS 73  BILITOT 0.3  PROT 5.9*  ALBUMIN 3.0*   Results for KIRON, OSMUN (MRN [id number]) as of 02/22/2013 21:38  Ref. Range 02/22/2013 17:22  Pro B Natriuretic peptide (BNP) Latest Range: 0-450 pg/mL 5556.0 (H)   No results found for this basename: LIPASE, AMYLASE,  in the last 72  hours  Recent Labs  02/22/13 1722  WBC 6.3  NEUTROABS 4.8  HGB 8.0*  HCT 23.8*  MCV 94.4  PLT 94*   No results found for this basename: CKTOTAL, CKMB, CKMBINDEX, TROPONINI,  in the last 72 hours No components found with this basename: POCBNP,  No results found for this basename: DDIMER,  in the last 72 hours No results found for this basename: HGBA1C,  in the last 72 hours No results found for this basename: CHOL, HDL, LDLCALC, TRIG, CHOLHDL, LDLDIRECT,  in the last 72 hours No results found for this basename: TSH, T4TOTAL, FREET3, T3FREE, THYROIDAB,  in the last 72 hours No results found for this basename: VITAMINB12, FOLATE, FERRITIN, TIBC, IRON, RETICCTPCT,  in the last 72 hours  Micro Results: No results found for this or any previous visit (from the past 240 hour(s)).   Radiological Exams on Admission: Dg Chest 2 View  02/22/2013   CLINICAL DATA:  Weakness, cough, hematuria   EXAM: CHEST  2 VIEW  COMPARISON:  04/01/2012  FINDINGS: Low lung volumes. There is diffuse prominence of the interstitial markings particularly in the perihilar regions. There are areas peribronchial cuffing. Focal area of consolidated density projects within the retrocardiac region on the left. The cardiac silhouette is within normal limits. The osseous structures are unremarkable.  IMPRESSION: Atelectasis versus consolidative infiltrate left lung base. A component of the effusion is also a diagnostic consideration. Interstitial infiltrate differential considerations are pulmonary edema versus an infectious or inflammatory infiltrate. Underlying component of pulmonary fibrosis is also a diagnostic consideration.   Electronically Signed   By: Salome Holmes M.D.   On: 02/22/2013 17:59   Mr Lumbar Spine Wo Contrast  02/22/2013   CLINICAL DATA:  Urinary/bowel incontinence. Poor rectal tone. History bladder cancer.  EXAM: MRI LUMBAR SPINE WITHOUT CONTRAST  TECHNIQUE: Multiplanar, multisequence MR imaging was performed. No intravenous contrast was administered.  COMPARISON:  None.  FINDINGS: New right hydroureteronephrosis, with ureteral dilatation extending below the level of imaging, likely to the bladder given previous findings. There is retroperitoneal perivertebral edematous on STIR imaging, likely reactive. Trabeculated bladder from chronic outlet obstruction. No definitive left-sided hydronephrosis, although poorly evaluated. There is urine in the bladder, arguing against complete bilateral obstruction.  No evidence of acute fracture, spine infection, or definitive metastasis. The marrow is heterogeneous, but without focal masslike abnormality. Heterogeneity is often from chronic hypoxemia or anemia. Normal conus signal and morphology.  Congenitally narrow spinal canal with diffuse, advanced degenerative changes superimposed.  L1-L2: Disc bulging and facet/ ligament overgrowth, narrowing the AP diameter of  the canal.  L2-L3: Advanced degenerative disc narrowing with posterior disc bulging and dorsal ligamentous and facet overgrowth resulting in triangular-shaped thecal sac, effacing much of the CSF. Superimposed left foraminal disc herniation causes asymmetric left foraminal stenosis.  L3-L4: Degenerative facet and ligament overgrowth. Facet spurring and leftward disc bulging narrows the left more than right foramen.  L4-L5: Advanced canal stenosis secondary to marked dorsal ligamentous thickening, facet overgrowth, and broad disc bulging. There is complete effacement of CSF. Disc and facet spurs narrow the inferior foramen bilaterally.  L5-S1:Advanced right foraminal narrowing, with no visible perineural fat, secondary to disc narrowing, disc bulging, and facet spurring.  IMPRESSION: 1. Right hydroureteronephrosis which has developed since 04/24/2012. 2. Advanced spinal canal stenosis at L4-5, secondary to congenitally narrow canal and superimposed degenerative disc and facet disease. 3. Diffuse degenerative disc and facet disease, with related stenoses described above. 4. Marrow heterogeneity, but no definitive spinal  metastasis.   Electronically Signed   By: Tiburcio Pea M.D.   On: 02/22/2013 19:05   EKG: Normal sinus rhythm  Assessment/Plan Present on Admission:  . Anemia likely due to acute hemorrhage Gross Hematuria -Urology Dr. Tarri Glenn and oncology Dr. Tawanna Sat are consulted to see patient in the a.m. -DC aspirin, type and screen and serial H&H's  -Patient has low platelets, unclear etiology, will repeat in a.m. -Will transfuse 1 unit packed red blood cell, especially as patient has some congestive heart failure component  Congestive heart failure -Like related to patient's anemia, Lasix IV ordered as well as a 2-D echo Diabetes mellitus ADA diet consent scale insulin Remote history of testicular cancer Recent history of bladder cancer   urology and oncology aware Hyperkalemia - very mild  but will give a low dose of Kayexalate  -Repeat BMP in a.m. Acute on chronic kidney failure Metabolic acidosis -Monitored carefully as patient receiving Lasix. No fluid resuscitation initiated due to fluid overload -Well add some oral bicarbonate, defer to a.m. team to consult nephrology if needed  Hypertension Dyslipidemia Gout -Stable home medications continued, with hold parameters on blood pressure medications Right hydronephrosis -Urology consulted  Advanced DJD -defer to AM team neurosurgery consult if needed. Spoke with radiologist, nothing emergent  SCDs for DVT prophylaxis CODE STATUS: DO NOT RESUSCITATE  Time in: 9:30PM Time out: 10:00PM  Xochitl Egle 02/22/2013, 9:34 PM

## 2013-02-23 DIAGNOSIS — R319 Hematuria, unspecified: Secondary | ICD-10-CM | POA: Diagnosis present

## 2013-02-23 DIAGNOSIS — I10 Essential (primary) hypertension: Secondary | ICD-10-CM

## 2013-02-23 DIAGNOSIS — I059 Rheumatic mitral valve disease, unspecified: Secondary | ICD-10-CM

## 2013-02-23 DIAGNOSIS — C679 Malignant neoplasm of bladder, unspecified: Secondary | ICD-10-CM

## 2013-02-23 DIAGNOSIS — D62 Acute posthemorrhagic anemia: Principal | ICD-10-CM

## 2013-02-23 DIAGNOSIS — N1330 Unspecified hydronephrosis: Secondary | ICD-10-CM

## 2013-02-23 DIAGNOSIS — R7989 Other specified abnormal findings of blood chemistry: Secondary | ICD-10-CM | POA: Diagnosis present

## 2013-02-23 LAB — CBC
Hemoglobin: 10 g/dL — ABNORMAL LOW (ref 13.0–17.0)
MCH: 30.7 pg (ref 26.0–34.0)
MCHC: 33.3 g/dL (ref 30.0–36.0)
Platelets: 102 10*3/uL — ABNORMAL LOW (ref 150–400)
RBC: 3.26 MIL/uL — ABNORMAL LOW (ref 4.22–5.81)
RDW: 14.9 % (ref 11.5–15.5)
WBC: 7.1 10*3/uL (ref 4.0–10.5)

## 2013-02-23 LAB — ABO/RH: ABO/RH(D): O POS

## 2013-02-23 LAB — URINE CULTURE: Colony Count: 50000

## 2013-02-23 LAB — IRON AND TIBC
Iron: <10 ug/dL — ABNORMAL LOW (ref 42–135)
UIBC: 230 ug/dL (ref 125–400)

## 2013-02-23 LAB — BASIC METABOLIC PANEL
BUN: 99 mg/dL — ABNORMAL HIGH (ref 6–23)
Calcium: 8.6 mg/dL (ref 8.4–10.5)
GFR calc Af Amer: 23 mL/min — ABNORMAL LOW (ref >90)
GFR calc non Af Amer: 20 mL/min — ABNORMAL LOW (ref >90)
Glucose, Bld: 199 mg/dL — ABNORMAL HIGH (ref 70–99)
Sodium: 141 mEq/L (ref 137–147)

## 2013-02-23 LAB — GLUCOSE, CAPILLARY
Glucose-Capillary: 128 mg/dL — ABNORMAL HIGH (ref 70–99)
Glucose-Capillary: 115 mg/dL — ABNORMAL HIGH (ref 70–99)
Glucose-Capillary: 163 mg/dL — ABNORMAL HIGH (ref 70–99)
Glucose-Capillary: 83 mg/dL (ref 70–99)

## 2013-02-23 LAB — HEMOGLOBIN AND HEMATOCRIT, BLOOD
HCT: 26.9 % — ABNORMAL LOW (ref 39.0–52.0)
HCT: 28.8 % — ABNORMAL LOW (ref 39.0–52.0)
HCT: 30.1 % — ABNORMAL LOW (ref 39.0–52.0)
Hemoglobin: 9.1 g/dL — ABNORMAL LOW (ref 13.0–17.0)
Hemoglobin: 9.6 g/dL — ABNORMAL LOW (ref 13.0–17.0)

## 2013-02-23 LAB — PRO B NATRIURETIC PEPTIDE: Pro B Natriuretic peptide (BNP): 5539 pg/mL — ABNORMAL HIGH (ref 0–450)

## 2013-02-23 LAB — PROTIME-INR: Prothrombin Time: 14.5 seconds (ref 11.6–15.2)

## 2013-02-23 LAB — ERYTHROPOIETIN: Erythropoietin: 16 m[IU]/mL (ref 2.6–18.5)

## 2013-02-23 LAB — PREPARE RBC (CROSSMATCH)

## 2013-02-23 MED ORDER — LEVOFLOXACIN IN D5W 750 MG/150ML IV SOLN
750.0000 mg | INTRAVENOUS | Status: DC
  Administered 2013-02-23: 750 mg via INTRAVENOUS
  Filled 2013-02-23: qty 150

## 2013-02-23 NOTE — Consult Note (Signed)
Reason for Consult: Hematuria, Right Hydronephrosis, Bladder Cancer  Referring Physician: Deiondre Harrower [name] is an 78 y.o. male.   HPI:   1 - Bladder Cancer - long h/o bladder cancer, most recently pT4 with prostatic stromal involment by TURBT 12/2012. Has been on bladder-sparing approach for several years due to comorbidity and h/o of prior abdominal-pelvic radiation for testis cancer in 1960s.  2 - Right Hydronephrosis / Chronic Renal Insuficiency- new rt mod hydro incidental on ER imaging 01/2013. Most recent TURBT with likely nodular overgrowth of Rt ureteral orifice. Cr baseline 2.6 range for last 2 years and stable this admission.  3 - Gross Hematuria - pt with gross hematuria on admit as wel as significant bacteruria. This has resolved in last day on Levaquin, final CX pending.  4 - Testis Cancer - s/p Right orchiectomy, RPLND, radiation many years ago, has been disease free for decades.  Today [name] is seen in consultation for above. His primary urologist is Dr. Retta Diones.  Past Medical History  Diagnosis Date  . Hypertension   . Heart murmur   . Gout     no attack in years  . Chronic kidney disease     RENAL INSUFFICIENCY  . Diabetes mellitus   . Bladder cancer   . Testicular cancer     1965  . Hypercholesteremia   . Osteoporosis   . Right foot drop     Past Surgical History  Procedure Laterality Date  . Orchiectomy  1965    right  . Turbt    . Fx femur  2006  . Cystoscopy w/ retrogrades  02/15/2011    Procedure: CYSTOSCOPY WITH RETROGRADE PYELOGRAM;  Surgeon: Marcine Matar, MD;  Location: [facility];  Service: Urology;  Laterality: Left;  Cystoscopy, Transurethral Resection of Bladder Tumor, Left Retrograde Pyelograms    . Transurethral resection of bladder tumor  02/15/2011    Procedure: TRANSURETHRAL RESECTION OF BLADDER TUMOR (TURBT);  Surgeon: Marcine Matar, MD;  Location: [facility];  Service: Urology;  Laterality: N/A;  . Cystoscopy w/  retrogrades Bilateral 04/06/2012    Procedure: CYSTOSCOPY WITH RETROGRADE PYELOGRAM;  Surgeon: Marcine Matar, MD;  Location: [facility];  Service: Urology;  Laterality: Bilateral;  CYSTO, TURBT, BILATERAL RETROGRADES AND INSTILLATION Mitomycin C  . Transurethral resection of bladder tumor N/A 04/06/2012    Procedure: TRANSURETHRAL RESECTION OF BLADDER TUMOR (TURBT);  Surgeon: Marcine Matar, MD;  Location: [facility];  Service: Urology;  Laterality: N/A;  . Tonsillectomy  as child  . Cataract extraction Right   . Transurethral resection of bladder tumor with gyrus (turbt-gyrus) N/A 12/31/2012    Procedure: TRANSURETHRAL RESECTION OF BLADDER TUMOR WITH GYRUS (TURBT-GYRUS);  Surgeon: Marcine Matar, MD;  Location: [facility];  Service: Urology;  Laterality: N/A;  . Transurethral resection of prostate N/A 12/31/2012    Procedure: TRANSURETHRAL RESECTION OF THE PROSTATE WITH GYRUS INSTRUMENTS;  Surgeon: Marcine Matar, MD;  Location: [facility];  Service: Urology;  Laterality: N/A;    Family History  Problem Relation Age of Onset  . Diabetes Father   . Diabetes Sister     Social History:  reports that he quit smoking about 45 years ago. His smoking use included Cigarettes. He smoked 0.00 packs per day. He has never used smokeless tobacco. He reports that he does not drink alcohol or use illicit drugs.  Allergies: No Known Allergies  Medications: I have reviewed the patient's current medications.  Results for orders placed during the hospital encounter of 02/22/13 (from  the past 48 hour(s))  URINALYSIS, ROUTINE W REFLEX MICROSCOPIC     Status: Abnormal   Collection Time    02/22/13  5:20 PM      Result Value Range   Color, Urine RED (*) YELLOW   Comment: BIOCHEMICALS MAY BE AFFECTED BY COLOR   APPearance CLOUDY (*) CLEAR   Specific Gravity, Urine 1.013  1.005 - 1.030   pH 5.5  5.0 - 8.0   Glucose, UA NEGATIVE  NEGATIVE mg/dL   Hgb urine dipstick LARGE (*) NEGATIVE   Bilirubin Urine MODERATE  (*) NEGATIVE   Ketones, ur NEGATIVE  NEGATIVE mg/dL   Protein, ur 409 (*) NEGATIVE mg/dL   Urobilinogen, UA 0.2  0.0 - 1.0 mg/dL   Nitrite POSITIVE (*) NEGATIVE   Leukocytes, UA LARGE (*) NEGATIVE  URINE MICROSCOPIC-ADD ON     Status: Abnormal   Collection Time    02/22/13  5:20 PM      Result Value Range   Squamous Epithelial / LPF RARE  RARE   WBC, UA 11-20  <3 WBC/hpf   RBC / HPF 21-50  <3 RBC/hpf   Bacteria, UA FEW (*) RARE   Casts HYALINE CASTS (*) NEGATIVE   Urine-Other MUCOUS PRESENT    CBC WITH DIFFERENTIAL     Status: Abnormal   Collection Time    02/22/13  5:22 PM      Result Value Range   WBC 6.3  4.0 - 10.5 K/uL   RBC 2.52 (*) 4.22 - 5.81 MIL/uL   Hemoglobin 8.0 (*) 13.0 - 17.0 g/dL   HCT 81.1 (*) 91.4 - 78.2 %   MCV 94.4  78.0 - 100.0 fL   MCH 31.7  26.0 - 34.0 pg   MCHC 33.6  30.0 - 36.0 g/dL   RDW 95.6  21.3 - 08.6 %   Platelets 94 (*) 150 - 400 K/uL   Comment: REPEATED TO VERIFY     SPECIMEN CHECKED FOR CLOTS     PLATELET COUNT CONFIRMED BY SMEAR   Neutrophils Relative % 77  43 - 77 %   Lymphocytes Relative 11 (*) 12 - 46 %   Monocytes Relative 11  3 - 12 %   Eosinophils Relative 1  0 - 5 %   Basophils Relative 0  0 - 1 %   Neutro Abs 4.8  1.7 - 7.7 K/uL   Lymphs Abs 0.7  0.7 - 4.0 K/uL   Monocytes Absolute 0.7  0.1 - 1.0 K/uL   Eosinophils Absolute 0.1  0.0 - 0.7 K/uL   Basophils Absolute 0.0  0.0 - 0.1 K/uL  COMPREHENSIVE METABOLIC PANEL     Status: Abnormal   Collection Time    02/22/13  5:22 PM      Result Value Range   Sodium 138  135 - 145 mEq/L   Potassium 5.2 (*) 3.5 - 5.1 mEq/L   Chloride 110  96 - 112 mEq/L   CO2 16 (*) 19 - 32 mEq/L   Glucose, Bld 167 (*) 70 - 99 mg/dL   BUN 578 (*) 6 - 23 mg/dL   Creatinine, Ser 4.69 (*) 0.50 - 1.35 mg/dL   Calcium 9.0  8.4 - 62.9 mg/dL   Total Protein 5.9 (*) 6.0 - 8.3 g/dL   Albumin 3.0 (*) 3.5 - 5.2 g/dL   AST 42 (*) 0 - 37 U/L   ALT 38  0 - 53 U/L   Alkaline Phosphatase 73  39 - 117 U/L  Total Bilirubin 0.3  0.3 - 1.2 mg/dL   GFR calc non Af Amer 19 (*) >90 mL/min   GFR calc Af Amer 22 (*) >90 mL/min   Comment: (NOTE)     The eGFR has been calculated using the CKD EPI equation.     This calculation has not been validated in all clinical situations.     eGFR's persistently <90 mL/min signify possible Chronic Kidney     Disease.  PRO B NATRIURETIC PEPTIDE     Status: Abnormal   Collection Time    02/22/13  5:22 PM      Result Value Range   Pro B Natriuretic peptide (BNP) 5556.0 (*) 0 - 450 pg/mL  OCCULT BLOOD, POC DEVICE     Status: None   Collection Time    02/22/13  5:27 PM      Result Value Range   Fecal Occult Bld NEGATIVE  NEGATIVE  APTT     Status: None   Collection Time    02/22/13 10:51 PM      Result Value Range   aPTT 35  24 - 37 seconds  PROTIME-INR     Status: None   Collection Time    02/22/13 10:51 PM      Result Value Range   Prothrombin Time 14.6  11.6 - 15.2 seconds   INR 1.16  0.00 - 1.49  RETICULOCYTES     Status: Abnormal   Collection Time    02/22/13 10:51 PM      Result Value Range   Retic Ct Pct 1.6  0.4 - 3.1 %   RBC. 2.87 (*) 4.22 - 5.81 MIL/uL   Retic Count, Manual 45.9  19.0 - 186.0 K/uL  SAVE SMEAR     Status: None   Collection Time    02/22/13 10:51 PM      Result Value Range   Smear Review SMEAR STAINED AND AVAILABLE FOR REVIEW    PREPARE RBC (CROSSMATCH)     Status: None   Collection Time    02/22/13 11:00 PM      Result Value Range   Order Confirmation ORDER PROCESSED BY BLOOD BANK    GLUCOSE, CAPILLARY     Status: None   Collection Time    02/22/13 11:17 PM      Result Value Range   Glucose-Capillary 94  70 - 99 mg/dL   Comment 1 Documented in Chart     Comment 2 Notify RN    FERRITIN     Status: None   Collection Time    02/23/13 12:05 AM      Result Value Range   Ferritin 80  22 - 322 ng/mL   Comment: Performed at [facility]  IRON AND TIBC     Status: Abnormal   Collection Time    02/23/13 12:05  AM      Result Value Range   Iron <10 (*) 42 - 135 ug/dL   TIBC Not calculated due to Iron <10.  215 - 435 ug/dL   Saturation Ratios Not calculated due to Iron <10.  20 - 55 %   UIBC 230  125 - 400 ug/dL   Comment: Performed at [facility]  ERYTHROPOIETIN     Status: None   Collection Time    02/23/13 12:05 AM      Result Value Range   Erythropoietin 16.0  2.6 - 18.5 mIU/mL   Comment: (NOTE)     Because of diurnal variations  in Erythropoietin levels, it is     important to collect samples at a consistent time of day.  Morning     samples collected between 7:30 AM and 12:00 noon are recommended.     Performed at [facility]  HEMOGLOBIN AND HEMATOCRIT, BLOOD     Status: Abnormal   Collection Time    02/23/13 12:05 AM      Result Value Range   Hemoglobin 9.1 (*) 13.0 - 17.0 g/dL   HCT 16.1 (*) 09.6 - 04.5 %  TYPE AND SCREEN     Status: None   Collection Time    02/23/13 12:05 AM      Result Value Range   ABO/RH(D) O POS     Antibody Screen POS     Sample Expiration 02/26/2013     Antibody Identification ANTI-E     PT AG Type NEGATIVE FOR E ANTIGEN     DAT, IgG NEG     Unit Number [id number]     Blood Component Type RED CELLS,LR     Unit division 00     Status of Unit ISSUED     Donor AG Type NEGATIVE FOR E ANTIGEN     Transfusion Status OK TO TRANSFUSE     Crossmatch Result COMPATIBLE     Unit Number [id number]     Blood Component Type RED CELLS,LR     Unit division 00     Status of Unit ALLOCATED     Donor AG Type NEGATIVE FOR E ANTIGEN     Transfusion Status OK TO TRANSFUSE     Crossmatch Result COMPATIBLE    ABO/RH     Status: None   Collection Time    02/23/13 12:05 AM      Result Value Range   ABO/RH(D) O POS    GLUCOSE, CAPILLARY     Status: Abnormal   Collection Time    02/23/13  7:39 AM      Result Value Range   Glucose-Capillary 128 (*) 70 - 99 mg/dL   Comment 1 Documented in Chart     Comment 2 Notify RN    PRO B NATRIURETIC  PEPTIDE     Status: Abnormal   Collection Time    02/23/13  9:00 AM      Result Value Range   Pro B Natriuretic peptide (BNP) 5539.0 (*) 0 - 450 pg/mL  BASIC METABOLIC PANEL     Status: Abnormal   Collection Time    02/23/13  9:00 AM      Result Value Range   Sodium 141  137 - 147 mEq/L   Comment: Please note change in reference range.   Potassium 4.5  3.7 - 5.3 mEq/L   Comment: Please note change in reference range.   Chloride 110  96 - 112 mEq/L   CO2 19  19 - 32 mEq/L   Glucose, Bld 199 (*) 70 - 99 mg/dL   BUN 99 (*) 6 - 23 mg/dL   Creatinine, Ser 9.62 (*) 0.50 - 1.35 mg/dL   Calcium 8.6  8.4 - 95.2 mg/dL   GFR calc non Af Amer 20 (*) >90 mL/min   GFR calc Af Amer 23 (*) >90 mL/min   Comment: (NOTE)     The eGFR has been calculated using the CKD EPI equation.     This calculation has not been validated in all clinical situations.     eGFR's persistently <90 mL/min signify possible Chronic Kidney  Disease.  CBC     Status: Abnormal   Collection Time    02/23/13  9:00 AM      Result Value Range   WBC 7.1  4.0 - 10.5 K/uL   RBC 3.26 (*) 4.22 - 5.81 MIL/uL   Hemoglobin 10.0 (*) 13.0 - 17.0 g/dL   HCT 09.8 (*) 11.9 - 14.7 %   MCV 92.0  78.0 - 100.0 fL   MCH 30.7  26.0 - 34.0 pg   MCHC 33.3  30.0 - 36.0 g/dL   RDW 82.9  56.2 - 13.0 %   Platelets 102 (*) 150 - 400 K/uL   Comment: SPECIMEN CHECKED FOR CLOTS     REPEATED TO VERIFY     CONSISTENT WITH PREVIOUS RESULT  PROTIME-INR     Status: None   Collection Time    02/23/13  9:00 AM      Result Value Range   Prothrombin Time 14.5  11.6 - 15.2 seconds   INR 1.15  0.00 - 1.49  GLUCOSE, CAPILLARY     Status: Abnormal   Collection Time    02/23/13 12:48 PM      Result Value Range   Glucose-Capillary 115 (*) 70 - 99 mg/dL   Comment 1 Documented in Chart     Comment 2 Notify RN    HEMOGLOBIN AND HEMATOCRIT, BLOOD     Status: Abnormal   Collection Time    02/23/13  2:40 PM      Result Value Range   Hemoglobin 10.1  (*) 13.0 - 17.0 g/dL   HCT 86.5 (*) 78.4 - 69.6 %  GLUCOSE, CAPILLARY     Status: Abnormal   Collection Time    02/23/13  5:16 PM      Result Value Range   Glucose-Capillary 163 (*) 70 - 99 mg/dL    Dg Chest 2 View  29/52/8413   CLINICAL DATA:  Weakness, cough, hematuria  EXAM: CHEST  2 VIEW  COMPARISON:  04/01/2012  FINDINGS: Low lung volumes. There is diffuse prominence of the interstitial markings particularly in the perihilar regions. There are areas peribronchial cuffing. Focal area of consolidated density projects within the retrocardiac region on the left. The cardiac silhouette is within normal limits. The osseous structures are unremarkable.  IMPRESSION: Atelectasis versus consolidative infiltrate left lung base. A component of the effusion is also a diagnostic consideration. Interstitial infiltrate differential considerations are pulmonary edema versus an infectious or inflammatory infiltrate. Underlying component of pulmonary fibrosis is also a diagnostic consideration.   Electronically Signed   By: Salome Holmes M.D.   On: 02/22/2013 17:59   Mr Lumbar Spine Wo Contrast  02/22/2013   CLINICAL DATA:  Urinary/bowel incontinence. Poor rectal tone. History bladder cancer.  EXAM: MRI LUMBAR SPINE WITHOUT CONTRAST  TECHNIQUE: Multiplanar, multisequence MR imaging was performed. No intravenous contrast was administered.  COMPARISON:  None.  FINDINGS: New right hydroureteronephrosis, with ureteral dilatation extending below the level of imaging, likely to the bladder given previous findings. There is retroperitoneal perivertebral edematous on STIR imaging, likely reactive. Trabeculated bladder from chronic outlet obstruction. No definitive left-sided hydronephrosis, although poorly evaluated. There is urine in the bladder, arguing against complete bilateral obstruction.  No evidence of acute fracture, spine infection, or definitive metastasis. The marrow is heterogeneous, but without focal  masslike abnormality. Heterogeneity is often from chronic hypoxemia or anemia. Normal conus signal and morphology.  Congenitally narrow spinal canal with diffuse, advanced degenerative changes superimposed.  L1-L2: Disc bulging and facet/  ligament overgrowth, narrowing the AP diameter of the canal.  L2-L3: Advanced degenerative disc narrowing with posterior disc bulging and dorsal ligamentous and facet overgrowth resulting in triangular-shaped thecal sac, effacing much of the CSF. Superimposed left foraminal disc herniation causes asymmetric left foraminal stenosis.  L3-L4: Degenerative facet and ligament overgrowth. Facet spurring and leftward disc bulging narrows the left more than right foramen.  L4-L5: Advanced canal stenosis secondary to marked dorsal ligamentous thickening, facet overgrowth, and broad disc bulging. There is complete effacement of CSF. Disc and facet spurs narrow the inferior foramen bilaterally.  L5-S1:Advanced right foraminal narrowing, with no visible perineural fat, secondary to disc narrowing, disc bulging, and facet spurring.  IMPRESSION: 1. Right hydroureteronephrosis which has developed since 04/24/2012. 2. Advanced spinal canal stenosis at L4-5, secondary to congenitally narrow canal and superimposed degenerative disc and facet disease. 3. Diffuse degenerative disc and facet disease, with related stenoses described above. 4. Marrow heterogeneity, but no definitive spinal metastasis.   Electronically Signed   By: Tiburcio Pea M.D.   On: 02/22/2013 19:05    Review of Systems  Constitutional: Positive for weight loss and malaise/fatigue.  HENT: Negative.   Eyes: Negative.   Respiratory: Negative.   Cardiovascular: Negative.   Gastrointestinal: Positive for diarrhea.  Genitourinary: Positive for hematuria. Negative for flank pain.  Musculoskeletal: Negative.   Skin: Negative.   Neurological: Negative.   Endo/Heme/Allergies: Negative.   Psychiatric/Behavioral: Negative.     Blood pressure 153/62, pulse 71, temperature 97.3 F (36.3 C), temperature source Oral, resp. rate 18, height 5\' 11"  (1.803 m), weight 66.6 kg (146 lb 13.2 oz), SpO2 98.00%. Physical Exam  Constitutional: He appears well-developed.  Cachectic, thin, pleasant  HENT:  Head: Normocephalic.  Eyes: Pupils are equal, round, and reactive to light.  Neck: Normal range of motion.  Cardiovascular: Normal rate.   Respiratory: Effort normal and breath sounds normal.  GI: Soft. Bowel sounds are normal.  Prior scars w/o hernias  Genitourinary: Penis normal.  Condom cath in place with grossly clear urine  Musculoskeletal: Normal range of motion.  Neurological: He is alert.  Skin: Skin is warm and dry.  Psychiatric: He has a normal mood and affect. His behavior is normal. Judgment and thought content normal.    Assessment/Plan:   1 - Bladder Cancer - recurrent and progressive, may be cause of new rt hydronephrosis.   2 - Right Hydronephrosis / Chronic Renal Insuficiency- GFR stable, but new rt hydro certainly could lead to progressive renal decline. Briefly outlined options of observation, neph tube, re-attempt stent.   3 - Gross Hematuria -Likely from UTI in setting of recent bladder tumor resection. Clearing. No specific intervention at this time.  4 - Testis Cancer - diseease free by most recetn imaging.  I will notify Dr. Retta Diones of pt's admission to discuss further management and diagnostics for his rt hydro and bladder cancer.     Larraine Argo 02/23/2013, 6:47 PM

## 2013-02-23 NOTE — Progress Notes (Signed)
Echocardiogram 2D Echocardiogram has been performed.  [name] 02/23/2013, 4:24 PM

## 2013-02-23 NOTE — Progress Notes (Signed)
ANTIBIOTIC CONSULT NOTE - INITIAL  Pharmacy Consult for Levaquin Indication: Suspected Pneumonia  No Known Allergies  Patient Measurements: Height: 5\' 11"  (180.3 cm) Weight: 146 lb 13.2 oz (66.6 kg) IBW/kg (Calculated) : 75.3  Vital Signs: Temp: 97.3 F (36.3 C) (12/30 1432) Temp src: Oral (12/30 1432) BP: 153/62 mmHg (12/30 1432) Pulse Rate: 71 (12/30 1432) Intake/Output from previous day: 12/29 0701 - 12/30 0700 In: 419 [Blood:419] Out: 550 [Urine:550] Intake/Output from this shift: Total I/O In: 720 [P.O.:720] Out: 425 [Urine:425]  Labs:  Recent Labs  02/22/13 1722 02/23/13 0005 02/23/13 0900 02/23/13 1440  WBC 6.3  --  7.1  --   HGB 8.0* 9.1* 10.0* 10.1*  PLT 94*  --  102*  --   CREATININE 2.99*  --  2.84*  --    Estimated Creatinine Clearance: 20.8 ml/min (by C-G formula based on Cr of 2.84). No results found for this basename: VANCOTROUGH, VANCOPEAK, VANCORANDOM, GENTTROUGH, GENTPEAK, GENTRANDOM, TOBRATROUGH, TOBRAPEAK, TOBRARND, AMIKACINPEAK, AMIKACINTROU, AMIKACIN,  in the last 72 hours   Microbiology: No results found for this or any previous visit (from the past 720 hour(s)).  Medical History: Past Medical History  Diagnosis Date  . Hypertension   . Heart murmur   . Gout     no attack in years  . Chronic kidney disease     RENAL INSUFFICIENCY  . Diabetes mellitus   . Bladder cancer   . Testicular cancer     1965  . Hypercholesteremia   . Osteoporosis   . Right foot drop     Medications:  Scheduled:  . allopurinol  100 mg Oral Daily  . amLODipine  10 mg Oral Daily  . furosemide  40 mg Intravenous BID  . hydrALAZINE  25 mg Oral TID  . insulin aspart  0-15 Units Subcutaneous TID WC  . insulin aspart  0-5 Units Subcutaneous QHS  . insulin glargine  9 Units Subcutaneous Daily  . metoprolol succinate  100 mg Oral Daily  . senna  1 tablet Oral BID  . simvastatin  20 mg Oral q1800  . sodium bicarbonate  650 mg Oral BID  . sodium chloride   3 mL Intravenous Q12H  . sodium chloride  3 mL Intravenous Q12H  . tamsulosin  0.4 mg Oral Daily  . vitamin B-12  1,000 mcg Oral Daily   Infusions:   Assessment:  78 year old male with h/o bladder cancer admitted 12/29 with hematuria   Urine culture ordered 12/29  Chest Xray on 12/29 shows atelectasis vs consolidative infiltrate left lung base  IV Levaquin per rx dosing ordered for suspected PNA  Scr = 2.84 with CrCl = 20.8 ml/min  Goal of Therapy:  Eradication of suspected infection  Plan:  Levaquin 750mg  IV q48h Follow renal function and adjust dosage further if CrCl worsens  Tasheem Elms, Joselyn Glassman, PharmD 02/23/2013,4:28 PM

## 2013-02-23 NOTE — Progress Notes (Signed)
[facility] PROGRESS NOTE  [name] NWG:[id number] DOB: 1934-06-03 DOA: 02/22/2013 PCP: Neldon Labella, MD  Brief narrative: 78 year old gentleman with past medical history of bladder cancer and most recently (12/31/2012) had TURBT who presented to [facility] ED 02/22/2013 with blood in urine for past few days prior to this admission in addition to urinary incontinence. His wife also reported he was having shortness of breath on exertion which specifically started 5 days prior to this admission. He also had cough but non productive. No fevers or chills.  In ED, BP was 149/75, HR 56 and T max 97.3 F, O2 saturation 96% on room air. He had no complaints of pain but he continue to have blood in the urine and lower extremity swelling. His CBC revealed hemoglobin of 9.1 and BMP revealed creatinine of 2.84. BNP was elevated at 5539. CXR showed consolidation in left lung base. Further evaluation included MRI thoracic spine which was significant for new right hydroureteronephrosis, with ureteral dilatation; also seen was advanced spinal canal stenosis at L4-L5.  Assessment/Plan:  Principal Problem:   Acute blood anemia due to hematuria - in pt with bladder cancer and recent TURBT - appreciate urology consult and evaluation - hemoglobin stable at 9.1 - continue to monitor CBC Active Problems:   Elevated proBNP - at 5539 - awaiting 2 D ECHO - on lasix 40 mg IV Q 12 hours - monitor weight daily and replace electrolytes as needed   Hyperkalemia - potassium 5.2 on the admission - is being given lasix   CKD, stage IV - creatinine 2.14 in 03/2012 - on this admission creatinine is 2.99 - may improve with lasix - follow up BMP in am   Right hydronephrosis - appreciate GU consult; spoke with Dr. Berneice Heinrich   Spinal canal stenosis - paged neurosurgery oncall, Dr. Yetta Barre said if there is no focal neurologic weakness or back pain then intervention is not warranted - PT eval ordered    Type I DM -  continue Lantus 9 units daily and sliding scale insulin   HYPERLIPIDEMIA - may continue statin therapy   GOUT - continue allopurinol   HYPERTENSION - continue norvasc 10 mg daily, hydralazine 25 mg TID and metoprolol 100 mg daily   Bladder cancer - will call GU for further input on management    Code Status: DNR/DNI Family Communication: spoke with pt wife over the phone [phone] Disposition Plan: needs PT evaluation  Manson Passey, MD  [facility] Pager [phone]  If 7PM-7AM, please contact night-coverage [email or web address] Password [email or web address] 02/23/2013, 3:55 PM   LOS: 1 day   Consultants:  Urology  Neurosurgery - phone call only   Procedures:  None   Antibiotics:  Levaquin 02/23/2013 -->  HPI/Subjective: Still blood in urine but make urine.   Objective: Filed Vitals:   02/23/13 0625 02/23/13 0645 02/23/13 0927 02/23/13 1432  BP: 150/61 156/68 169/54 153/62  Pulse: 78 78  71  Temp: 97.8 F (36.6 C) 97.8 F (36.6 C)  97.3 F (36.3 C)  TempSrc: Oral Oral  Oral  Resp: 18 20  18   Height:      Weight:      SpO2: 99% 99%  98%    Intake/Output Summary (Last 24 hours) at 02/23/13 1555 Last data filed at 02/23/13 1434  Gross per 24 hour  Intake   1139 ml  Output    975 ml  Net    164 ml    Exam:   General:  Pt is alert,  follows commands appropriately, not in acute distress  Cardiovascular: Regular rate and rhythm, S1/S2 appreciatedrrrrrrrrr   Respiratory: Clear to auscultation bilaterally, no wheezing, no crackles, no rhonchi  Abdomen: Soft, non tender, non distended, bowel sounds present, no guarding  Extremities: LE edema, pulses DP and PT palpable bilaterally  Neuro: Grossly non focal    Data Reviewed: Basic Metabolic Panel:  Recent Labs Lab 02/22/13 1722 02/23/13 0900  NA 138 141  K 5.2* 4.5  CL 110 110  CO2 16* 19  GLUCOSE 167* 199*  BUN 109* 99*  CREATININE 2.99* 2.84*  CALCIUM 9.0 8.6   Liver Function Tests:  Recent  Labs Lab 02/22/13 1722  AST 42*  ALT 38  ALKPHOS 73  BILITOT 0.3  PROT 5.9*  ALBUMIN 3.0*   No results found for this basename: LIPASE, AMYLASE,  in the last 168 hours No results found for this basename: AMMONIA,  in the last 168 hours CBC:  Recent Labs Lab 02/22/13 1722 02/23/13 0005 02/23/13 0900 02/23/13 1440  WBC 6.3  --  7.1  --   NEUTROABS 4.8  --   --   --   HGB 8.0* 9.1* 10.0* 10.1*  HCT 23.8* 26.9* 30.0* 30.1*  MCV 94.4  --  92.0  --   PLT 94*  --  102*  --    Cardiac Enzymes: No results found for this basename: CKTOTAL, CKMB, CKMBINDEX, TROPONINI,  in the last 168 hours BNP: No components found with this basename: POCBNP,  CBG:  Recent Labs Lab 02/22/13 2317 02/23/13 0739 02/23/13 1248  GLUCAP 94 128* 115*    No results found for this or any previous visit (from the past 240 hour(s)).   Studies: Dg Chest 2 View 02/22/2013     IMPRESSION: Atelectasis versus consolidative infiltrate left lung base. A component of the effusion is also a diagnostic consideration. Interstitial infiltrate differential considerations are pulmonary edema versus an infectious or inflammatory infiltrate. Underlying component of pulmonary fibrosis is also a diagnostic consideration.   Mr Lumbar Spine Wo Contrast 02/22/2013     IMPRESSION: 1. Right hydroureteronephrosis which has developed since 04/24/2012. 2. Advanced spinal canal stenosis at L4-5, secondary to congenitally narrow canal and superimposed degenerative disc and facet disease. 3. Diffuse degenerative disc and facet disease, with related stenoses described above. 4. Marrow heterogeneity, but no definitive spinal metastasis.      Scheduled Meds: . allopurinol  100 mg Oral Daily  . amLODipine  10 mg Oral Daily  . furosemide  40 mg Intravenous BID  . hydrALAZINE  25 mg Oral TID  . insulin aspart  0-15 Units Subcutaneous TID WC  . insulin aspart  0-5 Units Subcutaneous QHS  . insulin glargine  9 Units Subcutaneous  Daily  . metoprolol succinate  100 mg Oral Daily  . senna  1 tablet Oral BID  . simvastatin  20 mg Oral q1800  . sodium bicarbonate  650 mg Oral BID  . tamsulosin  0.4 mg Oral Daily  . vitamin B-12  1,000 mcg Oral Daily

## 2013-02-24 LAB — GLUCOSE, CAPILLARY: Glucose-Capillary: 149 mg/dL — ABNORMAL HIGH (ref 70–99)

## 2013-02-24 LAB — HEMOGLOBIN AND HEMATOCRIT, BLOOD
HCT: 30.3 % — ABNORMAL LOW (ref 39.0–52.0)
Hemoglobin: 10.1 g/dL — ABNORMAL LOW (ref 13.0–17.0)

## 2013-02-24 MED ORDER — LEVOFLOXACIN IN D5W 500 MG/100ML IV SOLN
500.0000 mg | INTRAVENOUS | Status: DC
  Administered 2013-02-25: 500 mg via INTRAVENOUS
  Filled 2013-02-24: qty 100

## 2013-02-24 NOTE — Progress Notes (Signed)
Patient ID: [name], male   DOB: 1935/02/09, 78 y.o.   MRN: [id number] [facility] PROGRESS NOTE  Damaree Sargent Coviello WUJ:[id number] DOB: [birth year] DOA: 02/22/2013 PCP: Neldon Labella, MD  Brief narrative: 78 year old gentleman with past medical history of bladder cancer and most recently (12/31/2012) had TURBT who presented to [facility] ED 02/22/2013 with blood in urine for past few days prior to this admission in addition to urinary incontinence. His wife also reported he was having shortness of breath on exertion which specifically started 5 days prior to this admission. He also had cough but non productive. No fevers or chills.   In ED, BP was 149/75, HR 56 and T max 97.3 F, O2 saturation 96% on room air. He had no complaints of pain but he continue to have blood in the urine and lower extremity swelling. His CBC revealed hemoglobin of 9.1 and BMP revealed creatinine of 2.84. BNP was elevated at 5539. CXR showed consolidation in left lung base.  Further evaluation included MRI thoracic spine which was significant for new right hydroureteronephrosis, with ureteral dilatation; also seen was advanced spinal canal stenosis at L4-L5.   Assessment/Plan:  Principal Problem:  Acute blood anemia due to hematuria  - in pt with bladder cancer and recent TURBT  - appreciate urology consult and evaluation, will continue to follow up on recommendations - hemoglobin stable over the past 24 hours  - continue to monitor CBC  Active Problems:  Elevated proBNP  - at 5539  - awaiting 2 D ECHO  - on lasix 40 mg IV Q 12 hours  - monitor weight daily and replace electrolytes as needed  Hyperkalemia  - potassium 5.2 on the admission  - now resolved and within normal limits this AM CKD, stage IV  - creatinine 2.14 in 03/2012  - on this admission creatinine is 2.99  - cr trending down - follow up BMP in am  Right hydronephrosis  - appreciate GU consult; spoke with Dr. Berneice Heinrich  - will follow up on  recommendations  Spinal canal stenosis  - paged neurosurgery oncall, Dr. Yetta Barre said if there is no focal neurologic weakness or back pain then intervention is not warranted  - PT eval ordered  Type I DM  - continue Lantus 9 units daily and sliding scale insulin  HYPERLIPIDEMIA  - may continue statin therapy  GOUT  - continue allopurinol  HYPERTENSION  - continue norvasc 10 mg daily, hydralazine 25 mg TID and metoprolol 100 mg daily  Bladder cancer  - will call GU for further input on management   Code Status: DNR/DNI  Family Communication: spoke with pt wife over the phone [phone]  Disposition Plan: needs PT evaluation   Manson Passey, MD  [facility]  Pager [phone]   Consultants:  Urology  Neurosurgery - phone call only  Procedures:  None  Antibiotics:  Levaquin 02/23/2013 -->  If 7PM-7AM, please contact night-coverage [email or web address] Password [facility] 02/24/2013, 4:45 PM   LOS: 2 days   HPI/Subjective: No chest pain or shortness of breath.   Objective: Filed Vitals:   02/23/13 2040 02/23/13 2216 02/24/13 0506 02/24/13 1430  BP: 158/64  154/53 136/53  Pulse: 73  72 68  Temp: 98.8 F (37.1 C)  98.5 F (36.9 C) 97.8 F (36.6 C)  TempSrc: Oral  Oral Oral  Resp: 24  16 18   Height:      Weight:  67.1 kg (147 lb 14.9 oz) 65.046 kg (143 lb 6.4 oz)  SpO2: 99%  98% 98%    Intake/Output Summary (Last 24 hours) at 02/24/13 1645 Last data filed at 02/24/13 1433  Gross per 24 hour  Intake    420 ml  Output   3530 ml  Net  -3110 ml    Exam:   General:  Pt is alert, follows commands appropriately, not in acute distress  Cardiovascular: Regular rate and rhythm, S1/S2, no murmurs, no rubs, no gallops  Respiratory: Clear to auscultation bilaterally, no wheezing, no crackles, no rhonchi  Abdomen: Soft, non tender, non distended, bowel sounds present, no guarding  Extremities: No edema, pulses DP and PT palpable bilaterally  Neuro: Grossly  nonfocal  Data Reviewed: Basic Metabolic Panel:  Recent Labs Lab 02/22/13 1722 02/23/13 0900  NA 138 141  K 5.2* 4.5  CL 110 110  CO2 16* 19  GLUCOSE 167* 199*  BUN 109* 99*  CREATININE 2.99* 2.84*  CALCIUM 9.0 8.6   Liver Function Tests:  Recent Labs Lab 02/22/13 1722  AST 42*  ALT 38  ALKPHOS 73  BILITOT 0.3  PROT 5.9*  ALBUMIN 3.0*   CBC:  Recent Labs Lab 02/22/13 1722  02/23/13 0900 02/23/13 1440 02/23/13 2240 02/24/13 0712 02/24/13 1433  WBC 6.3  --  7.1  --   --   --   --   NEUTROABS 4.8  --   --   --   --   --   --   HGB 8.0*  < > 10.0* 10.1* 9.6* 10.1* 10.2*  HCT 23.8*  < > 30.0* 30.1* 28.8* 30.4* 30.3*  MCV 94.4  --  92.0  --   --   --   --   PLT 94*  --  102*  --   --   --   --   < > = values in this interval not displayed. CBG:  Recent Labs Lab 02/23/13 1248 02/23/13 1716 02/23/13 2152 02/24/13 0746 02/24/13 1141  GLUCAP 115* 163* 83 89 149*    Recent Results (from the past 240 hour(s))  URINE CULTURE     Status: None   Collection Time    02/22/13  5:20 PM      Result Value Range Status   Specimen Description URINE, CLEAN CATCH   Final   Special Requests A   Final   Culture  Setup Time     Final   Value: 02/23/2013 01:38     Performed at [facility] Count     Final   Value: 50,000 COLONIES/ML     Performed at [facility]   Culture     Final   Value: Multiple bacterial morphotypes present, none predominant. Suggest appropriate recollection if clinically indicated.     Performed at [facility]   Report Status 02/23/2013 FINAL   Final     Studies: Dg Chest 2 View  02/22/2013   CLINICAL DATA:  Weakness, cough, hematuria  EXAM: CHEST  2 VIEW  COMPARISON:  04/01/2012  FINDINGS: Low lung volumes. There is diffuse prominence of the interstitial markings particularly in the perihilar regions. There are areas peribronchial cuffing. Focal area of consolidated density projects within the retrocardiac  region on the left. The cardiac silhouette is within normal limits. The osseous structures are unremarkable.  IMPRESSION: Atelectasis versus consolidative infiltrate left lung base. A component of the effusion is also a diagnostic consideration. Interstitial infiltrate differential considerations are pulmonary edema versus an infectious or inflammatory infiltrate. Underlying component  of pulmonary fibrosis is also a diagnostic consideration.   Electronically Signed   By: Salome Holmes M.D.   On: 02/22/2013 17:59   Mr Lumbar Spine Wo Contrast  02/22/2013   CLINICAL DATA:  Urinary/bowel incontinence. Poor rectal tone. History bladder cancer.  EXAM: MRI LUMBAR SPINE WITHOUT CONTRAST  TECHNIQUE: Multiplanar, multisequence MR imaging was performed. No intravenous contrast was administered.  COMPARISON:  None.  FINDINGS: New right hydroureteronephrosis, with ureteral dilatation extending below the level of imaging, likely to the bladder given previous findings. There is retroperitoneal perivertebral edematous on STIR imaging, likely reactive. Trabeculated bladder from chronic outlet obstruction. No definitive left-sided hydronephrosis, although poorly evaluated. There is urine in the bladder, arguing against complete bilateral obstruction.  No evidence of acute fracture, spine infection, or definitive metastasis. The marrow is heterogeneous, but without focal masslike abnormality. Heterogeneity is often from chronic hypoxemia or anemia. Normal conus signal and morphology.  Congenitally narrow spinal canal with diffuse, advanced degenerative changes superimposed.  L1-L2: Disc bulging and facet/ ligament overgrowth, narrowing the AP diameter of the canal.  L2-L3: Advanced degenerative disc narrowing with posterior disc bulging and dorsal ligamentous and facet overgrowth resulting in triangular-shaped thecal sac, effacing much of the CSF. Superimposed left foraminal disc herniation causes asymmetric left foraminal  stenosis.  L3-L4: Degenerative facet and ligament overgrowth. Facet spurring and leftward disc bulging narrows the left more than right foramen.  L4-L5: Advanced canal stenosis secondary to marked dorsal ligamentous thickening, facet overgrowth, and broad disc bulging. There is complete effacement of CSF. Disc and facet spurs narrow the inferior foramen bilaterally.  L5-S1:Advanced right foraminal narrowing, with no visible perineural fat, secondary to disc narrowing, disc bulging, and facet spurring.  IMPRESSION: 1. Right hydroureteronephrosis which has developed since 04/24/2012. 2. Advanced spinal canal stenosis at L4-5, secondary to congenitally narrow canal and superimposed degenerative disc and facet disease. 3. Diffuse degenerative disc and facet disease, with related stenoses described above. 4. Marrow heterogeneity, but no definitive spinal metastasis.   Electronically Signed   By: Tiburcio Pea M.D.   On: 02/22/2013 19:05    Scheduled Meds: . allopurinol  100 mg Oral Daily  . amLODipine  10 mg Oral Daily  . furosemide  40 mg Intravenous BID  . hydrALAZINE  25 mg Oral TID  . insulin aspart  0-15 Units Subcutaneous TID WC  . insulin aspart  0-5 Units Subcutaneous QHS  . insulin glargine  9 Units Subcutaneous Daily  . [START ON 02/25/2013] levofloxacin (LEVAQUIN) IV  500 mg Intravenous Q48H  . metoprolol succinate  100 mg Oral Daily  . senna  1 tablet Oral BID  . simvastatin  20 mg Oral q1800  . sodium bicarbonate  650 mg Oral BID  . tamsulosin  0.4 mg Oral Daily  . vitamin B-12  1,000 mcg Oral Daily   Continuous Infusions:

## 2013-02-24 NOTE — Progress Notes (Addendum)
ANTIBIOTIC CONSULT NOTE - FOLLOW UP  Pharmacy Consult for levofloxacin Indication: rule out pneumonia  No Known Allergies  Patient Measurements: Height: 5\' 11"  (180.3 cm) Weight: 143 lb 6.4 oz (65.046 kg) IBW/kg (Calculated) : 75.3 Adjusted Body Weight:   Vital Signs: Temp: 98.5 F (36.9 C) (12/31 0506) Temp src: Oral (12/31 0506) BP: 154/53 mmHg (12/31 0506) Pulse Rate: 72 (12/31 0506) Intake/Output from previous day: 12/30 0701 - 12/31 0700 In: 820 [P.O.:720; IV Piggyback:100] Out: 2575 [Urine:2575] Intake/Output from this shift: Total I/O In: -  Out: 900 [Urine:900]  Labs:  Recent Labs  02/22/13 1722  02/23/13 0900 02/23/13 1440 02/23/13 2240 02/24/13 0712  WBC 6.3  --  7.1  --   --   --   HGB 8.0*  < > 10.0* 10.1* 9.6* 10.1*  PLT 94*  --  102*  --   --   --   CREATININE 2.99*  --  2.84*  --   --   --   < > = values in this interval not displayed. Estimated Creatinine Clearance: 20.3 ml/min (by C-G formula based on Cr of 2.84). No results found for this basename: VANCOTROUGH, Leodis Binet, VANCORANDOM, GENTTROUGH, GENTPEAK, GENTRANDOM, TOBRATROUGH, TOBRAPEAK, TOBRARND, AMIKACINPEAK, AMIKACINTROU, AMIKACIN,  in the last 72 hours   Microbiology: Recent Results (from the past 720 hour(s))  URINE CULTURE     Status: None   Collection Time    02/22/13  5:20 PM      Result Value Range Status   Specimen Description URINE, CLEAN CATCH   Final   Special Requests A   Final   Culture  Setup Time     Final   Value: 02/23/2013 01:38     Performed at [facility] Count     Final   Value: 50,000 COLONIES/ML     Performed at [facility]   Culture     Final   Value: Multiple bacterial morphotypes present, none predominant. Suggest appropriate recollection if clinically indicated.     Performed at [facility]   Report Status 02/23/2013 FINAL   Final    Anti-infectives   Start     Dose/Rate Route Frequency Ordered Stop   02/25/13 1600   levofloxacin (LEVAQUIN) IVPB 500 mg     500 mg 100 mL/hr over 60 Minutes Intravenous Every 48 hours 02/24/13 1118     02/23/13 1700  levofloxacin (LEVAQUIN) IVPB 750 mg  Status:  Discontinued     750 mg 100 mL/hr over 90 Minutes Intravenous Every 48 hours 02/23/13 1633 02/24/13 1118      Assessment: 78 YOM presents with gross hematuria (history of bladder cancer), also c/o SOB with CXR revealing interstitial infiltrate representing possible edema vs. Infection or inflammation. Started on Levofloxacin  12/30 >>Levaquin >>  Tmax: afeb WBCs: 7.1 Renal: Scr = 2.84 (CrCl = 20 ml/min)   12/29 urine: 50K multiple morphologies  Goal of Therapy:  Appropriately dose antibiotics for renal function  Plan:   Based on CrCl 77ml/min, change subsequent levofloxacin doses to 500mg  IV q48h  Follow at distance and adjust dose if renal function improves  Juliette Alcide, PharmD, BCPS.   Pager: [phone]  02/24/2013,11:19 AM

## 2013-02-24 NOTE — Evaluation (Addendum)
Physical Therapy Evaluation Patient Details Name: [name] MRN: [id number] DOB: 1934/07/25 Today's Date: 02/24/2013 Time: 7829-5621 PT Time Calculation (min): 40 min  PT Assessment / Plan / Recommendation History of Present Illness  78 yo male admitted with hematuria, weakness, anemia. Hx of bladder cancer, gout, R foot drop, chronic LE edema  Clinical Impression  On eval, pt required Min assist for mobility-able to ambulate ~135 feet with walker. Demonstrates general weakness, decreased activity tolerance, and impaired gait and balance. Dyspnea 3/4 with ambulation. Discussed d/c plan-pt states wife is interested in him going to [facility] rehab before returning home to maximize independence and function. Explained to pt they he could possibly return home with HHPT, 24/7 care if wife is able to assist him. Pt does have 13 steps that he would have to be able to climb since his bedroom is upstairs-this is a concern for pt.     PT Assessment  Patient needs continued PT services    Follow Up Recommendations  SNF;Supervision/Assistance - 24 hour (pt states wife would like him to go to rehab before returning home)    Does the patient have the potential to tolerate intense rehabilitation      Barriers to Discharge        Equipment Recommendations  None recommended by PT    Recommendations for Other Services OT consult   Frequency Min 3X/week    Precautions / Restrictions Precautions Precautions: Fall Required Braces or Orthoses: Other Brace/Splint Other Brace/Splint: pt states he wears R AFO? "built into shoe". did not wear during evaluation however Restrictions Weight Bearing Restrictions: No   Pertinent Vitals/Pain R sided lower back pain-unrated      Mobility  Bed Mobility Bed Mobility: Supine to Sit Supine to Sit: 4: Min guard;HOB elevated;With rails Transfers Transfers: Sit to Stand;Stand to Sit Sit to Stand: 4: Min assist;From bed Stand to Sit: 4: Min assist;To  chair/3-in-1 Details for Transfer Assistance: Assist to rise, stabilize, control descent Ambulation/Gait Ambulation/Gait Assistance: 4: Min assist Ambulation Distance (Feet): 135 Feet Assistive device: Rolling walker Ambulation/Gait Assistance Details: Assist to stabilize throughout ambulaiton. Followed with recliner due to pt states he fatigues easily. Began to fatigue after ~115 feet or so. Dyspnea 3/4.  Gait Pattern: Step-through pattern;Trunk flexed;Decreased stride length; Steppage gait R LE    Exercises     PT Diagnosis: Difficulty walking;Generalized weakness;Abnormality of gait  PT Problem List: Decreased strength;Decreased activity tolerance;Decreased mobility;Decreased balance PT Treatment Interventions: DME instruction;Gait training;Functional mobility training;Therapeutic activities;Stair training;Therapeutic exercise;Balance training;Patient/family education     PT Goals(Current goals can be found in the care plan section) Acute Rehab PT Goals Patient Stated Goal: get stronger. possibly rehab PT Goal Formulation: With patient Time For Goal Achievement: 03/10/13 Potential to Achieve Goals: Good  Visit Information  Last PT Received On: 02/24/13 Assistance Needed: +1 History of Present Illness: 78 yo male admitted with hematuria, weakness, anemia. Hx of bladder cancer, gout, R foot drop, chronic LE edema       Prior Functioning  Home Living Family/patient expects to be discharged to:: Private residence Living Arrangements: Spouse/significant other Available Help at Discharge: Family Type of Home: House Home Access: Stairs to enter [employer] of Steps: 1 step through garage Home Layout: Two level;Bed/bath upstairs Alternate Level Stairs-Number of Steps: 13 steps up to bedroom Home Equipment: Shower seat;Cane - single point;Walker - 2 wheels Prior Function Level of Independence: Needs assistance Gait / Transfers Assistance Needed: wife assist pt when  ascending steps-uses 1 rail, 1 cane  Communication Communication: No difficulties    Cognition  Cognition Arousal/Alertness: Awake/alert Behavior During Therapy: WFL for tasks assessed/performed Overall Cognitive Status: Within Functional Limits for tasks assessed    Extremity/Trunk Assessment Upper Extremity Assessment Upper Extremity Assessment: Generalized weakness Lower Extremity Assessment Lower Extremity Assessment: RLE deficits/detail;LLE deficits/detail RLE Deficits / Details: DF/PF 0/5, knee ext 4/5, hip flex 3+/5 LLE Deficits / Details: L LE WFL Cervical / Trunk Assessment Cervical / Trunk Assessment: Kyphotic   Balance    End of Session PT - End of Session Equipment Utilized During Treatment: Gait belt Activity Tolerance: Patient limited by fatigue Patient left: in chair;with call bell/phone within reach;with nursing/sitter in room  GP     Rebeca Alert, MPT Pager: [phone]

## 2013-02-25 LAB — GLUCOSE, CAPILLARY
GLUCOSE-CAPILLARY: 187 mg/dL — AB (ref 70–99)
Glucose-Capillary: 77 mg/dL (ref 70–99)
Glucose-Capillary: 146 mg/dL — ABNORMAL HIGH (ref 70–99)
Glucose-Capillary: 125 mg/dL — ABNORMAL HIGH (ref 70–99)

## 2013-02-25 LAB — CBC
HCT: 30.8 % — ABNORMAL LOW (ref 39.0–52.0)
Hemoglobin: 10.2 g/dL — ABNORMAL LOW (ref 13.0–17.0)
MCH: 30.5 pg (ref 26.0–34.0)
MCHC: 33.1 g/dL (ref 30.0–36.0)
MCV: 92.2 fL (ref 78.0–100.0)
Platelets: 103 10*3/uL — ABNORMAL LOW (ref 150–400)
RBC: 3.34 MIL/uL — ABNORMAL LOW (ref 4.22–5.81)
RDW: 14.8 % (ref 11.5–15.5)
WBC: 8.2 10*3/uL (ref 4.0–10.5)

## 2013-02-25 LAB — BASIC METABOLIC PANEL
BUN: 96 mg/dL — ABNORMAL HIGH (ref 6–23)
CO2: 24 mEq/L (ref 19–32)
Calcium: 8.6 mg/dL (ref 8.4–10.5)
Chloride: 103 mEq/L (ref 96–112)
Creatinine, Ser: 3.08 mg/dL — ABNORMAL HIGH (ref 0.50–1.35)
GFR calc Af Amer: 21 mL/min — ABNORMAL LOW (ref >90)
GFR calc non Af Amer: 18 mL/min — ABNORMAL LOW (ref >90)
Glucose, Bld: 84 mg/dL (ref 70–99)
Potassium: 3.8 mEq/L (ref 3.7–5.3)
Sodium: 140 mEq/L (ref 137–147)

## 2013-02-25 MED ORDER — SODIUM BICARBONATE 650 MG PO TABS: 650.0000 mg | ORAL_TABLET | Freq: Two times a day (BID) | ORAL | Status: DC

## 2013-02-25 MED ORDER — FUROSEMIDE 20 MG PO TABS: 20.0000 mg | ORAL_TABLET | Freq: Every day | ORAL | Status: DC

## 2013-02-25 MED ORDER — SENNA 8.6 MG PO TABS: 1.0000 | ORAL_TABLET | Freq: Two times a day (BID) | ORAL | Status: AC

## 2013-02-25 NOTE — Progress Notes (Signed)
Patient ID: [name], male   DOB: Apr 22, 1934, 79 y.o.   MRN: [id number] [facility] PROGRESS NOTE  [name] IEP:[id number] DOB: 1934/07/22 DOA: 02/22/2013 PCP: Neldon Labella, MD  Brief narrative: 79 year old gentleman with past medical history of bladder cancer and most recently (12/31/2012) had TURBT who presented to [facility] ED 02/22/2013 with blood in urine for past few days prior to this admission in addition to urinary incontinence. His wife also reported he was having shortness of breath on exertion which specifically started 5 days prior to this admission. He also had cough but non productive. No fevers or chills.  In ED, BP was 149/75, HR 56 and T max 97.3 F, O2 saturation 96% on room air. He had no complaints of pain but he continue to have blood in the urine and lower extremity swelling. His CBC revealed hemoglobin of 9.1 and BMP revealed creatinine of 2.84. BNP was elevated at 5539. CXR showed consolidation in left lung base.  Further evaluation included MRI thoracic spine which was significant for new right hydroureteronephrosis, with ureteral dilatation; also seen was advanced spinal canal stenosis at L4-L5.   Assessment/Plan:   Principal Problem:  Acute blood anemia due to hematuria  - in pt with bladder cancer and recent TURBT  - cleared up - urology has seen the pt with no recommendation for intervention - avoid NSAID's for now  - hemoglobin stable over the past 24 hours  - continue to monitor CBC  Active Problems:  Elevated proBNP  - at 5539  - awaiting 2 D ECHO  - on lasix 40 mg IV Q 12 hours  - monitor weight daily and replace electrolytes as needed  Hyperkalemia  - potassium 5.2 on the admission  - potassium now WNL CKD, stage IV  - creatinine 2.14 in 03/2012  - on this admission creatinine is 2.99  - cr trending down  - follow up BMP in am  Right hydronephrosis  - appreciate GU consult; spoke with Dr. Berneice Heinrich  - no intervention indicated at  this time - has urine output  Spinal canal stenosis  - paged neurosurgery oncall, Dr. Yetta Barre said if there is no focal neurologic weakness or back pain then intervention is not warranted  - PT eval ordered - recommended SNF Type I DM  - continue Lantus 9 units daily and sliding scale insulin  - CBG's in past 24 hours: 145, 77 and 146 HYPERLIPIDEMIA  - may continue statin therapy  GOUT  - continue allopurinol  HYPERTENSION  - continue norvasc 10 mg daily, hydralazine 25 mg TID and metoprolol 100 mg daily  - BP 145/55 Bladder cancer  - no other recommendations per GU  Code Status: DNR/DNI  Family Communication: spoke with pt wife over the phone [phone]  Disposition Plan: SW to assist D/C plan to SNF per PT recommendations  Consultants:  Urology  Neurosurgery - phone call only  Procedures:  None  Antibiotics:  Levaquin 02/23/2013 -->   Manson Passey, MD  [facility] Pager [phone]  If 7PM-7AM, please contact night-coverage [email or web address] Password [email or web address] 02/25/2013, 10:39 AM   LOS: 3 days    HPI/Subjective: Does not report hematuria.  Objective: Filed Vitals:   02/24/13 1430 02/24/13 2108 02/25/13 0530 02/25/13 0600  BP: 136/53 144/53 146/57   Pulse: 68 65 68   Temp: 97.8 F (36.6 C) 97.7 F (36.5 C) 98.1 F (36.7 C)   TempSrc: Oral Oral Oral   Resp: 18 16 16  Height:      Weight:    64.547 kg (142 lb 4.8 oz)  SpO2: 98% 100% 98%     Intake/Output Summary (Last 24 hours) at 02/25/13 1039 Last data filed at 02/25/13 0745  Gross per 24 hour  Intake    480 ml  Output   3030 ml  Net  -2550 ml    Exam:   General:  Pt is alert, follows commands appropriately, not in acute distress  Cardiovascular: Regular rate and rhythm, S1/S2 appreciated  Respiratory: Clear to auscultation bilaterally, no wheezing  Abdomen: Soft, non tender, non distended, bowel sounds present, no guarding  Extremities: No edema, pulses DP and PT palpable  bilaterally  Neuro: Grossly nonfocal  Data Reviewed: Basic Metabolic Panel:  Recent Labs Lab 02/22/13 1722 02/23/13 0900 02/25/13 0423  NA 138 141 140  K 5.2* 4.5 3.8  CL 110 110 103  CO2 16* 19 24  GLUCOSE 167* 199* 84  BUN 109* 99* 96*  CREATININE 2.99* 2.84* 3.08*  CALCIUM 9.0 8.6 8.6   Liver Function Tests:  Recent Labs Lab 02/22/13 1722  AST 42*  ALT 38  ALKPHOS 73  BILITOT 0.3  PROT 5.9*  ALBUMIN 3.0*   No results found for this basename: LIPASE, AMYLASE,  in the last 168 hours No results found for this basename: AMMONIA,  in the last 168 hours CBC:  Recent Labs Lab 02/22/13 1722  02/23/13 0900 02/23/13 1440 02/23/13 2240 02/24/13 0712 02/24/13 1433 02/25/13 0423  WBC 6.3  --  7.1  --   --   --   --  8.2  NEUTROABS 4.8  --   --   --   --   --   --   --   HGB 8.0*  < > 10.0* 10.1* 9.6* 10.1* 10.2* 10.2*  HCT 23.8*  < > 30.0* 30.1* 28.8* 30.4* 30.3* 30.8*  MCV 94.4  --  92.0  --   --   --   --  92.2  PLT 94*  --  102*  --   --   --   --  103*  < > = values in this interval not displayed. Cardiac Enzymes: No results found for this basename: CKTOTAL, CKMB, CKMBINDEX, TROPONINI,  in the last 168 hours BNP: No components found with this basename: POCBNP,  CBG:  Recent Labs Lab 02/24/13 0746 02/24/13 1141 02/24/13 1715 02/24/13 2143 02/25/13 0713  GLUCAP 89 149* 168* 145* 77    Recent Results (from the past 240 hour(s))  URINE CULTURE     Status: None   Collection Time    02/22/13  5:20 PM      Result Value Range Status   Specimen Description URINE, CLEAN CATCH   Final   Special Requests A   Final   Culture  Setup Time     Final   Value: 02/23/2013 01:38     Performed at [place]     Final   Value: 50,000 COLONIES/ML     Performed at [facility]   Culture     Final   Value: Multiple bacterial morphotypes present, none predominant. Suggest appropriate recollection if clinically indicated.      Performed at [facility]   Report Status 02/23/2013 FINAL   Final     Studies: No results found.  Scheduled Meds: . allopurinol  100 mg Oral Daily  . amLODipine  10 mg Oral Daily  . furosemide  40 mg Intravenous BID  . hydrALAZINE  25 mg Oral TID  . insulin aspart  0-15 Units Subcutaneous TID WC  . insulin aspart  0-5 Units Subcutaneous QHS  . insulin glargine  9 Units Subcutaneous Daily  . levofloxacin (LEVAQUIN) IV  500 mg Intravenous Q48H  . metoprolol succinate  100 mg Oral Daily  . senna  1 tablet Oral BID  . simvastatin  20 mg Oral q1800  . sodium bicarbonate  650 mg Oral BID  . tamsulosin  0.4 mg Oral Daily  . vitamin B-12  1,000 mcg Oral Daily   Continuous Infusions:

## 2013-02-25 NOTE — ED Provider Notes (Signed)
Medical screening examination/treatment/procedure(s) were conducted as a shared visit with non-physician practitioner(s) and myself.  I personally evaluated the patient during the encounter   .Face to face Exam:  General:  A&Ox3 HEENT:  Atraumatic Resp:  Normal effort Abd:  Nondistended Neuro:No focal deficits     Dot Lanes, MD 02/25/13 1053

## 2013-02-26 LAB — GLUCOSE, CAPILLARY
GLUCOSE-CAPILLARY: 98 mg/dL (ref 70–99)
GLUCOSE-CAPILLARY: 148 mg/dL — AB (ref 70–99)
Glucose-Capillary: 274 mg/dL — ABNORMAL HIGH (ref 70–99)
Glucose-Capillary: 107 mg/dL — ABNORMAL HIGH (ref 70–99)

## 2013-02-26 MED ORDER — LEVOFLOXACIN 500 MG PO TABS
500.0000 mg | ORAL_TABLET | ORAL | Status: DC
  Administered 2013-02-27 – 2013-03-01 (×2): 500 mg via ORAL
  Filled 2013-02-26 (×2): qty 1

## 2013-02-26 NOTE — Progress Notes (Signed)
Clinical Social Work Department CLINICAL SOCIAL WORK PLACEMENT NOTE 02/26/2013  Patient:  [name], [name]  Account Number:  [id number] Admit date:  02/22/2013  Clinical Social Worker:  Renold Genta  Date/time:  02/26/2013 01:31 PM  Clinical Social Work is seeking post-discharge placement for this patient at the following level of care:   SKILLED NURSING   (*CSW will update this form in Epic as items are completed)   02/26/2013  Patient/family provided with [place] Department of Clinical Social Work's list of facilities offering this level of care within the geographic area requested by the patient (or if unable, by the patient's family).  02/26/2013  Patient/family informed of their freedom to choose among providers that offer the needed level of care, that participate in Medicare, Medicaid or managed care program needed by the patient, have an available bed and are willing to accept the patient.  02/26/2013  Patient/family informed of [facility]' ownership interest in [facility], as well as of the fact that they are under no obligation to receive care at this facility.  PASARR submitted to EDS on 02/26/2013 PASARR number received from EDS on 02/26/2013  FL2 transmitted to all facilities in geographic area requested by pt/family on  02/26/2013 FL2 transmitted to all facilities within larger geographic area on   Patient informed that his/her managed care company has contracts with or will negotiate with  certain facilities, including the following:   [facility]     Patient/family informed of bed offers received:  02/26/2013 Patient chooses bed at [place] Physician recommends and patient chooses bed at    Patient to be transferred to [place] on   Patient to be transferred to facility by   The following physician request were entered in Epic:   Additional Comments: Winfred Leeds, [place] Worker cell #: [phone]

## 2013-02-26 NOTE — Progress Notes (Signed)
Physical Therapy Treatment Patient Details Name: [name] MRN: [id number] DOB: September 14, 1934 Today's Date: 02/26/2013 Time: 1015-1040 PT Time Calculation (min): 25 min  PT Assessment / Plan / Recommendation  History of Present Illness 79 yo male admitted with hematuria, weakness, anemia. Hx of bladder cancer, gout, R foot drop, chronic LE edema   PT Comments   Pt OOB in recliner stated he was feeling better but still very weak.  Amb pt limited distance in hallway.  Very unsteady gait with tremors throughout which increase with increase fatigue.  HIGH FALL RISK.  Pt will need ST Rehab at SNF prior to D/C to home.   Follow Up Recommendations  SNF ([place])     Does the patient have the potential to tolerate intense rehabilitation     Barriers to Discharge        Equipment Recommendations       Recommendations for Other Services    Frequency Min 3X/week   Progress towards PT Goals Progress towards PT goals: Progressing toward goals  Plan      Precautions / Restrictions Precautions Precautions: Fall Required Braces or Orthoses: Other Brace/Splint Other Brace/Splint: pt states he wears R AFO? "built into shoe". did not wear during this session Restrictions Weight Bearing Restrictions: No    Pertinent Vitals/Pain C/o "mild" back pain "comes and goes"    Mobility  Bed Mobility Bed Mobility: Not assessed Details for Bed Mobility Assistance: Pt OOB in recliner Transfers Transfers: Sit to Stand;Stand to Sit Sit to Stand: 4: Min guard;4: Min assist;From chair/3-in-1 Stand to Sit: 4: Min guard;4: Min assist;To chair/3-in-1 Details for Transfer Assistance: Assist to rise, stabilize, control descent Ambulation/Gait Ambulation/Gait Assistance: 4: Min Wellsite geologist (Feet): 125 Feet Assistive device: Rolling walker Ambulation/Gait Assistance Details: Unsteady gait in which shaling increases with fatigue.  Limited amb tolerance with MAX c/o B LE weakness.  Also  noted 3/4 DOE in which pt requires freq rest breaks to complete task.  HIGH FALL RISK Gait Pattern: Step-through pattern;Trunk flexed;Decreased stride length Gait velocity: decreased      PT Goals (current goals can now be found in the care plan section)    Visit Information  Last PT Received On: 02/26/13 Assistance Needed: +1 History of Present Illness: 79 yo male admitted with hematuria, weakness, anemia. Hx of bladder cancer, gout, R foot drop, chronic LE edema    Subjective Data      Cognition       Balance     End of Session PT - End of Session Equipment Utilized During Treatment: Gait belt Activity Tolerance: Patient limited by fatigue Patient left: in chair;with call bell/phone within reach;with nursing/sitter in room   [name]  PTA [facility]  Acute  Rehab Pager      [phone]

## 2013-02-26 NOTE — Progress Notes (Signed)
Patient ID: [name], male   DOB: [birth year], 79 y.o.   MRN: [id number]  [facility] PROGRESS NOTE  Chistian Kasler Gonzales PZW:[id number] DOB: 27-Jul-1934 DOA: 02/22/2013 PCP: Tawanna Solo, MD  Brief narrative: 79 year old gentleman with past medical history of bladder cancer and most recently (12/31/2012) had TURBT who presented to [facility] ED 02/22/2013 with blood in urine for past few days prior to this admission in addition to urinary incontinence. His wife also reported he was having shortness of breath on exertion which specifically started 5 days prior to this admission. He also had cough but non productive. No fevers or chills.   In ED, BP was 149/75, HR 56 and T max 97.3 F, O2 saturation 96% on room air. He had no complaints of pain but he continue to have blood in the urine and lower extremity swelling. His CBC revealed hemoglobin of 9.1 and BMP revealed creatinine of 2.84. BNP was elevated at 5539. CXR showed consolidation in left lung base.  Further evaluation included MRI thoracic spine which was significant for new right hydroureteronephrosis, with ureteral dilatation; also seen was advanced spinal canal stenosis at L4-L5.   Assessment/Plan:  Principal Problem:  Acute blood anemia due to hematuria  - in pt with bladder cancer and recent TURBT  - urology has seen the pt with no recommendation for intervention  - avoid NSAID's for now  - hemoglobin stable over the past 24 hours  - continue to monitor CBC  Active Problems:  Elevated proBNP  - at 5539  - 2 D ECHO with normal EF and grade I diastolic dysfunction  - on lasix 40 mg IV Q 12 hours  - monitor weight daily and replace electrolytes as needed  - currently clii=nically compensated  Hyperkalemia  - potassium 5.2 on the admission  - potassium now WNL  CKD, stage IV  - creatinine 2.14 in 03/2012  - on this admission creatinine is 2.99  - BMP in AM Right hydronephrosis  - appreciate GU consult; spoke with Dr.  Tresa Moore  - no intervention indicated at this time  - has urine output  Spinal canal stenosis  - paged neurosurgery oncall, Dr. Ronnald Ramp said if there is no focal neurologic weakness or back pain then intervention is not warranted  - PT eval ordered - recommended SNF and placement in progress  Type I DM  - continue Lantus 9 units daily and sliding scale insulin  - CBG's in past 24 hours: 145, 77 and 146  HYPERLIPIDEMIA  - may continue statin therapy  GOUT  - continue allopurinol  HYPERTENSION  - continue norvasc 10 mg daily, hydralazine 25 mg TID and metoprolol 100 mg daily  - BP 145/55  Bladder cancer  - no other recommendations per GU   Code Status: DNR/DNI  Family Communication: spoke with pt wife over the phone [phone]  Disposition Plan: SW to assist D/C plan to SNF per PT recommendations   Consultants:  Urology  Neurosurgery - phone call only  Procedures:  None  Antibiotics:  Levaquin 02/23/2013 --  Leisa Lenz, MD  [facility] Pager [phone]  If 7PM-7AM, please contact night-coverage [email or web address] Password [email or web address] 02/26/2013, 1:58 PM   LOS: 4 days   HPI/Subjective: No events overnight.   Objective: Filed Vitals:   02/25/13 2123 02/26/13 0455 02/26/13 1123 02/26/13 1318  BP: 138/67 125/60 141/55 131/56  Pulse: 71 69 69 69  Temp: 97.9 F (36.6 C) 98 F (36.7 C)  97.3 F (36.3 C)  TempSrc: Oral Oral  Oral  Resp: 16 16  16   Height:      Weight:  64.4 kg (141 lb 15.6 oz)    SpO2: 98% 100%  100%    Intake/Output Summary (Last 24 hours) at 02/26/13 1358 Last data filed at 02/26/13 1235  Gross per 24 hour  Intake    960 ml  Output   2025 ml  Net  -1065 ml    Exam:   General:  Pt is alert, follows commands appropriately, not in acute distress  Cardiovascular: Regular rate and rhythm, S1/S2, no murmurs, no rubs, no gallops  Respiratory: Clear to auscultation bilaterally, no wheezing, no crackles, no rhonchi  Abdomen: Soft, non tender, non  distended, bowel sounds present, no guarding  Extremities: No edema, pulses DP and PT palpable bilaterally  Neuro: Grossly nonfocal  Data Reviewed: Basic Metabolic Panel:  Recent Labs Lab 02/22/13 1722 02/23/13 0900 02/25/13 0423  NA 138 141 140  K 5.2* 4.5 3.8  CL 110 110 103  CO2 16* 19 24  GLUCOSE 167* 199* 84  BUN 109* 99* 96*  CREATININE 2.99* 2.84* 3.08*  CALCIUM 9.0 8.6 8.6   Liver Function Tests:  Recent Labs Lab 02/22/13 1722  AST 42*  ALT 38  ALKPHOS 73  BILITOT 0.3  PROT 5.9*  ALBUMIN 3.0*   CBC:  Recent Labs Lab 02/22/13 1722  02/23/13 0900 02/23/13 1440 02/23/13 2240 02/24/13 0712 02/24/13 1433 02/25/13 0423  WBC 6.3  --  7.1  --   --   --   --  8.2  NEUTROABS 4.8  --   --   --   --   --   --   --   HGB 8.0*  < > 10.0* 10.1* 9.6* 10.1* 10.2* 10.2*  HCT 23.8*  < > 30.0* 30.1* 28.8* 30.4* 30.3* 30.8*  MCV 94.4  --  92.0  --   --   --   --  92.2  PLT 94*  --  102*  --   --   --   --  103*  < > = values in this interval not displayed. CBG:  Recent Labs Lab 02/25/13 1154 02/25/13 1708 02/25/13 2124 02/26/13 0727 02/26/13 1201  GLUCAP 146* 187* 125* 98 148*    Recent Results (from the past 240 hour(s))  URINE CULTURE     Status: None   Collection Time    02/22/13  5:20 PM      Result Value Range Status   Specimen Description URINE, CLEAN CATCH   Final   Special Requests A   Final   Culture  Setup Time     Final   Value: 02/23/2013 01:38     Performed at [place]     Final   Value: 50,000 COLONIES/ML     Performed at [facility]   Culture     Final   Value: Multiple bacterial morphotypes present, none predominant. Suggest appropriate recollection if clinically indicated.     Performed at [facility]   Report Status 02/23/2013 FINAL   Final     Studies: No results found.  Scheduled Meds: . allopurinol  100 mg Oral Daily  . amLODipine  10 mg Oral Daily  . furosemide  40 mg  Intravenous BID  . hydrALAZINE  25 mg Oral TID  . insulin aspart  0-15 Units Subcutaneous TID WC  . insulin aspart  0-5 Units Subcutaneous QHS  .  insulin glargine  9 Units Subcutaneous Daily  . [START ON 02/27/2013] levofloxacin  500 mg Oral Q48H  . metoprolol succinate  100 mg Oral Daily  . senna  1 tablet Oral BID  . simvastatin  20 mg Oral q1800  . sodium bicarbonate  650 mg Oral BID  . tamsulosin  0.4 mg Oral Daily  . vitamin B-12  1,000 mcg Oral Daily   Continuous Infusions:

## 2013-02-26 NOTE — Progress Notes (Signed)
ANTIBIOTIC CONSULT NOTE - FOLLOW UP  Pharmacy Consult for Levaquin Indication: pneumonia  No Known Allergies   Vital Signs: Temp: 98 F (36.7 C) (01/02 0455) Temp src: Oral (01/02 0455) BP: 125/60 mmHg (01/02 0455) Pulse Rate: 69 (01/02 0455)  Labs:  Recent Labs  02/24/13 0712 02/24/13 1433 02/25/13 0423  WBC  --   --  8.2  HGB 10.1* 10.2* 10.2*  PLT  --   --  103*  CREATININE  --   --  3.08*    Assessment: 79 year old male with h/o bladder cancer admitted 12/29 with hematuria. Chest Xray on 12/29 shows atelectasis vs consolidative infiltrate left lung base. IV Levaquin per rx dosing ordered for suspected PNA  12/30 >> Levaquin >>  Tmax: afeb WBCs: 8.2 Renal: Scr = 3.08 - increasing (CrCl = 19 ml/min)   12/29 urine: 50K multiple morphologies  Today is D#4 Levaquin. No new labs today, Scr still elevated. UOP 1.8 ml/kg/hr yesterday.   Plan:   Change Levaquin to 500 mg PO q48h  Pharmacy will f/u peripherally   Vanessa , PharmD, BCPS Pager: [phone] 10:21 AM Pharmacy #: [phone]

## 2013-02-26 NOTE — Progress Notes (Signed)
Clinical Social Work Department BRIEF PSYCHOSOCIAL ASSESSMENT 02/26/2013  Patient:  [name], [name]     Account Number:  [id number]     Admit date:  02/22/2013  Clinical Social Worker:  Renold Genta  Date/Time:  02/26/2013 12:20 PM  Referred by:  Physician  Date Referred:  02/26/2013 Referred for  SNF Placement   Other Referral:   Interview type:  Patient Other interview type:    PSYCHOSOCIAL DATA Living Status:  WIFE Admitted from facility:   Level of care:   Primary support name:  Jadis Mika (wife) ph#: [phone] Primary support relationship to patient:  SPOUSE Degree of support available:   good    CURRENT CONCERNS Current Concerns  Post-Acute Placement   Other Concerns:    SOCIAL WORK ASSESSMENT / PLAN CSW received referral for SNF placement.   Assessment/plan status:  Information/Referral to [employer] Other assessment/ plan:   Information/referral to community resources:   CSW completed FL2 and faxed information out to [facility]. Provided bed offers to patient - he accepted bed offer at [place] @ SNF made aware. Clinical information submitted to [facility] for authorization - anticipating approval Monday. Dr. Charlies Silvers aware.    PATIENT'S/FAMILY'S RESPONSE TO PLAN OF CARE: Patient is agreeable with plan for SNF - anticipating discharge Monday. CSW will follow-up with [facility] for authorization.       Winfred Leeds, [facility] Clinical Social Worker cell #: [phone]

## 2013-02-26 NOTE — Progress Notes (Signed)
Subjective:  1 - Bladder Cancer - long h/o bladder cancer, most recently pT4 with prostatic stromal involment by TURBT 12/2012. Has been on bladder-sparing approach for several years due to comorbidity and h/o of prior abdominal-pelvic radiation for testis cancer in 1960s.   2 - Right Hydronephrosis / Chronic Renal Insuficiency- new rt mod hydro incidental on ER imaging 01/2013. Most recent TURBT with likely nodular overgrowth of Rt ureteral orifice. Cr baseline 2.6 range for last 2 years and highest about 3.0 this admission. UOP and K remain acceptable.    3 - Gross Hematuria - pt with gross hematuria on admit as wel as significant bacteruria. This has resolved.   4 - Testis Cancer - s/p Right orchiectomy, RPLND, radiation many years ago, has been disease free for decades.   Today [name] is seen in f/u above. His primary urologist is Dr. Diona Fanti, who is out of town at the moment.   Objective: Vital signs in last 24 hours: Temp:  [97.9 F (36.6 C)-98 F (36.7 C)] 98 F (36.7 C) (01/02 0455) Pulse Rate:  [68-71] 69 (01/02 0455) Resp:  [16] 16 (01/02 0455) BP: (125-144)/(55-67) 125/60 mmHg (01/02 0455) SpO2:  [98 %-100 %] 100 % (01/02 0455) Weight:  [64.4 kg (141 lb 15.6 oz)] 64.4 kg (141 lb 15.6 oz) (01/02 0455) Last BM Date: 02/25/13  Intake/Output from previous day: 01/01 0701 - 01/02 0700 In: 960 [P.O.:960] Out: 2825 [Urine:2825] Intake/Output this shift: Total I/O In: 360 [P.O.:360] Out: -   General appearance: alert, cooperative, appears older than stated age and cachectic Head: Normocephalic, without obvious abnormality, atraumatic Eyes: conjunctivae/corneas clear. PERRL, EOM's intact. Fundi benign. Ears: normal TM's and external ear canals both ears Nose: Nares normal. Septum midline. Mucosa normal. No drainage or sinus tenderness. Throat: lips, mucosa, and tongue normal; teeth and gums normal Neck: no adenopathy, no carotid bruit, no JVD, supple, symmetrical,  trachea midline and thyroid not enlarged, symmetric, no tenderness/mass/nodules Back: symmetric, no curvature. ROM normal. No CVA tenderness. Resp: clear to auscultation bilaterally Chest wall: no tenderness Cardio: regular rate and rhythm, S1, S2 normal, no murmur, click, rub or gallop GI: soft, non-tender; bowel sounds normal; no masses,  no organomegaly Male genitalia: normal, condom cath in place, urine clear Extremities: extremities normal, atraumatic, no cyanosis or edema Pulses: 2+ and symmetric Lymph nodes: Cervical, supraclavicular, and axillary nodes normal. Neurologic: Grossly normal, some pill-rolling tremor bilat UE's.  Lab Results:   Recent Labs  02/23/13 0900  02/24/13 1433 02/25/13 0423  WBC 7.1  --   --  8.2  HGB 10.0*  < > 10.2* 10.2*  HCT 30.0*  < > 30.3* 30.8*  PLT 102*  --   --  103*  < > = values in this interval not displayed. BMET  Recent Labs  02/23/13 0900 02/25/13 0423  NA 141 140  K 4.5 3.8  CL 110 103  CO2 19 24  GLUCOSE 199* 84  BUN 99* 96*  CREATININE 2.84* 3.08*  CALCIUM 8.6 8.6   PT/INR  Recent Labs  02/23/13 0900  LABPROT 14.5  INR 1.15   ABG No results found for this basename: PHART, PCO2, PO2, HCO3,  in the last 72 hours  Studies/Results: No results found.  Anti-infectives: Anti-infectives   Start     Dose/Rate Route Frequency Ordered Stop   02/25/13 1600  levofloxacin (LEVAQUIN) IVPB 500 mg     500 mg 100 mL/hr over 60 Minutes Intravenous Every 48 hours 02/24/13 1118  02/23/13 1700  levofloxacin (LEVAQUIN) IVPB 750 mg  Status:  Discontinued     750 mg 100 mL/hr over 90 Minutes Intravenous Every 48 hours 02/23/13 1633 02/24/13 1118      Assessment/Plan:  1 - Bladder Cancer - recurrent and progressive, may be cause of new rt hydronephrosis.   2 - Right Hydronephrosis / Chronic Renal Insuficiency- GFR stable, but new rt hydro certainly could lead to progressive renal decline. Briefly outlined options of  observation, neph tube, re-attempt stent. Pt wants observation at this point. This is reasonable as UOP and K acceptable.  3 - Gross Hematuria -Likely from UTI in setting of recent bladder tumor resection. Clearing. No specific intervention at this time.   4 - Testis Cancer - diseease free by most recetn imaging.   I will verify GU follow-up within next month for recheck BMP and MD visit. OK for DC from GU perspective.    [facility], Navreet Bolda 02/26/2013

## 2013-02-27 DIAGNOSIS — E1029 Type 1 diabetes mellitus with other diabetic kidney complication: Secondary | ICD-10-CM

## 2013-02-27 LAB — GLUCOSE, CAPILLARY
GLUCOSE-CAPILLARY: 148 mg/dL — AB (ref 70–99)
Glucose-Capillary: 111 mg/dL — ABNORMAL HIGH (ref 70–99)
Glucose-Capillary: 185 mg/dL — ABNORMAL HIGH (ref 70–99)
Glucose-Capillary: 194 mg/dL — ABNORMAL HIGH (ref 70–99)

## 2013-02-27 LAB — CBC
HEMATOCRIT: 28.7 % — AB (ref 39.0–52.0)
Hemoglobin: 9.5 g/dL — ABNORMAL LOW (ref 13.0–17.0)
MCH: 30.4 pg (ref 26.0–34.0)
MCHC: 33.1 g/dL (ref 30.0–36.0)
MCV: 92 fL (ref 78.0–100.0)
Platelets: 113 10*3/uL — ABNORMAL LOW (ref 150–400)
RBC: 3.12 MIL/uL — ABNORMAL LOW (ref 4.22–5.81)
RDW: 14.3 % (ref 11.5–15.5)
WBC: 8 10*3/uL (ref 4.0–10.5)

## 2013-02-27 LAB — TYPE AND SCREEN
ABO/RH(D): O POS
Antibody Screen: POSITIVE
DAT, IgG: NEGATIVE
Donor AG Type: NEGATIVE
Donor AG Type: NEGATIVE
PT AG Type: NEGATIVE
Unit division: 0
Unit division: 0

## 2013-02-27 LAB — BASIC METABOLIC PANEL
BUN: 111 mg/dL — AB (ref 6–23)
CO2: 25 mEq/L (ref 19–32)
Calcium: 8.5 mg/dL (ref 8.4–10.5)
Chloride: 100 mEq/L (ref 96–112)
Creatinine, Ser: 3.56 mg/dL — ABNORMAL HIGH (ref 0.50–1.35)
GFR calc Af Amer: 18 mL/min — ABNORMAL LOW (ref >90)
GFR, EST NON AFRICAN AMERICAN: 15 mL/min — AB (ref >90)
Glucose, Bld: 144 mg/dL — ABNORMAL HIGH (ref 70–99)
Potassium: 3.8 mEq/L (ref 3.7–5.3)
Sodium: 139 mEq/L (ref 137–147)

## 2013-02-27 NOTE — Progress Notes (Signed)
Patient ID: [name], male   DOB: 1934/03/27, 79 y.o.   MRN: [id number]  [facility] PROGRESS NOTE  Favor Hackler Pandolfi EXB:[id number] DOB: 1934/06/15 DOA: 02/22/2013 PCP: Tawanna Solo, MD  Brief narrative: 79 year old gentleman with past medical history of bladder cancer and most recently (12/31/2012) had TURBT who presented to [facility] ED 02/22/2013 with blood in urine for past few days prior to this admission in addition to urinary incontinence. His wife also reported he was having shortness of breath on exertion which specifically started 5 days prior to this admission. He also had cough but non productive. No fevers or chills.   In ED, BP was 149/75, HR 56 and T max 97.3 F, O2 saturation 96% on room air. He had no complaints of pain but he continue to have blood in the urine and lower extremity swelling. His CBC revealed hemoglobin of 9.1 and BMP revealed creatinine of 2.84. BNP was elevated at 5539. CXR showed consolidation in left lung base.  Further evaluation included MRI thoracic spine which was significant for new right hydroureteronephrosis, with ureteral dilatation; also seen was advanced spinal canal stenosis at L4-L5.   Assessment/Plan:  Principal Problem:  Acute blood anemia due to hematuria  - in pt with bladder cancer and recent TURBT  - urology has seen the pt with no recommendation for intervention  - avoid NSAID's for now  - hemoglobin stable9.5-10.5 w/o transfusion  - continue to monitor CBC daily   Active Problems:  Elevated proBNP  - at 5539 on admission  - 2 D ECHO with normal EF and grade I diastolic dysfunction  - was on lasix 40 mg IV Q 12 hours , but Cr now 3.5 (above baseline) and climbing x 48 hours . Will hold lasix and monitor Cr  - monitor weight daily and replace electrolytes as needed  - currently clinically compensated   Questionable LL infiltrate on CXR  - on emperic levaquin day 5/7   Hyperkalemia  - potassium 5.2 on the admission  -  potassium now WNL   CKD, stage IV  - creatinine 2.14 in 03/2012  - Cr now 3.5. Hydronephrosis on MRI this admission. Will hold lasix 40mg  IV today, monitor in AM, consider repeat renal u/s if needed given hydro on prior MRI   Right hydronephrosis  - appreciate GU consult; spoke with Dr. Tresa Moore  - no intervention indicated at this time  - has urine output   Spinal canal stenosis  - paged neurosurgery oncall, Dr. Ronnald Ramp said if there is no focal neurologic weakness or back pain then intervention is not warranted  - PT eval ordered - recommended SNF and placement in progress   Type I DM  - continue Lantus 9 units daily and sliding scale insulin  - FSBS eratic at times but controlled in general   HYPERLIPIDEMIA  - may continue statin therapy   GOUT  - continue allopurinol   HYPERTENSION  - continue norvasc 10 mg daily, hydralazine 25 mg TID and metoprolol 100 mg daily   Bladder cancer  - no other recommendations per GU   Code Status: DNR/DNI  Family Communication: spoke with pt wife over the phone [phone]  Disposition Plan: SW to assist D/C plan to SNF Monday? per PT recommendations   Consultants:  Urology  Neurosurgery - phone call only  Procedures:  None  Antibiotics:  Levaquin 02/23/2013 --  Velna Hatchet, MD  [facility] Pager [phone]  If 7PM-7AM, please contact night-coverage [email or web address] Password  [email or web address] 02/27/2013, 11:47 AM   LOS: 5 days   HPI/Subjective: No events overnight.   Objective: Filed Vitals:   02/26/13 1123 02/26/13 1318 02/26/13 2100 02/27/13 0503  BP: 141/55 131/56 129/53 147/55  Pulse: 69 69 73 71  Temp:  97.3 F (36.3 C) 97.6 F (36.4 C) 98.8 F (37.1 C)  TempSrc:  Oral Oral Oral  Resp:  16 16 16   Height:      Weight:    61.8 kg (136 lb 3.9 oz)  SpO2:  100% 99% 100%    Intake/Output Summary (Last 24 hours) at 02/27/13 1147 Last data filed at 02/27/13 1100  Gross per 24 hour  Intake    720 ml  Output   1475 ml  Net    -755 ml    Exam:   General:  Pt is alert, follows commands appropriately, not in acute distress  Cardiovascular: Regular rate and rhythm, S1/S2, no murmurs, no rubs, no gallops  Respiratory: Clear to auscultation bilaterally, no wheezing, no crackles, no rhonchi  Abdomen: Soft, non tender, non distended, bowel sounds present, no guarding  Extremities: No edema, pulses DP and PT palpable bilaterally  Neuro: Grossly nonfocal  Data Reviewed: Basic Metabolic Panel:  Recent Labs Lab 02/22/13 1722 02/23/13 0900 02/25/13 0423 02/27/13 0400  NA 138 141 140 139  K 5.2* 4.5 3.8 3.8  CL 110 110 103 100  CO2 16* 19 24 25   GLUCOSE 167* 199* 84 144*  BUN 109* 99* 96* 111*  CREATININE 2.99* 2.84* 3.08* 3.56*  CALCIUM 9.0 8.6 8.6 8.5   Liver Function Tests:  Recent Labs Lab 02/22/13 1722  AST 42*  ALT 38  ALKPHOS 73  BILITOT 0.3  PROT 5.9*  ALBUMIN 3.0*   CBC:  Recent Labs Lab 02/22/13 1722  02/23/13 0900  02/23/13 2240 02/24/13 0712 02/24/13 1433 02/25/13 0423 02/27/13 0400  WBC 6.3  --  7.1  --   --   --   --  8.2 8.0  NEUTROABS 4.8  --   --   --   --   --   --   --   --   HGB 8.0*  < > 10.0*  < > 9.6* 10.1* 10.2* 10.2* 9.5*  HCT 23.8*  < > 30.0*  < > 28.8* 30.4* 30.3* 30.8* 28.7*  MCV 94.4  --  92.0  --   --   --   --  92.2 92.0  PLT 94*  --  102*  --   --   --   --  103* 113*  < > = values in this interval not displayed. CBG:  Recent Labs Lab 02/26/13 0727 02/26/13 1201 02/26/13 1648 02/26/13 2204 02/27/13 0736  GLUCAP 98 148* 274* 107* 111*    Recent Results (from the past 240 hour(s))  URINE CULTURE     Status: None   Collection Time    02/22/13  5:20 PM      Result Value Range Status   Specimen Description URINE, CLEAN CATCH   Final   Special Requests A   Final   Culture  Setup Time     Final   Value: 02/23/2013 01:38     Performed at [facility]     Final   Value: 50,000 COLONIES/ML     Performed at [facility]   Culture     Final   Value: Multiple bacterial morphotypes present, none predominant. Suggest appropriate recollection if  clinically indicated.     Performed at [facility]   Report Status 02/23/2013 FINAL   Final     Studies: No results found.  Scheduled Meds: . allopurinol  100 mg Oral Daily  . amLODipine  10 mg Oral Daily  . hydrALAZINE  25 mg Oral TID  . insulin aspart  0-15 Units Subcutaneous TID WC  . insulin aspart  0-5 Units Subcutaneous QHS  . insulin glargine  9 Units Subcutaneous Daily  . levofloxacin  500 mg Oral Q48H  . metoprolol succinate  100 mg Oral Daily  . senna  1 tablet Oral BID  . simvastatin  20 mg Oral q1800  . sodium bicarbonate  650 mg Oral BID  . tamsulosin  0.4 mg Oral Daily  . vitamin B-12  1,000 mcg Oral Daily   Continuous Infusions:

## 2013-02-28 DIAGNOSIS — N179 Acute kidney failure, unspecified: Secondary | ICD-10-CM

## 2013-02-28 DIAGNOSIS — R799 Abnormal finding of blood chemistry, unspecified: Secondary | ICD-10-CM

## 2013-02-28 LAB — BASIC METABOLIC PANEL
BUN: 111 mg/dL — ABNORMAL HIGH (ref 6–23)
CO2: 24 mEq/L (ref 19–32)
Calcium: 8.9 mg/dL (ref 8.4–10.5)
Chloride: 99 mEq/L (ref 96–112)
Creatinine, Ser: 3.54 mg/dL — ABNORMAL HIGH (ref 0.50–1.35)
GFR calc Af Amer: 18 mL/min — ABNORMAL LOW (ref >90)
GFR, EST NON AFRICAN AMERICAN: 15 mL/min — AB (ref >90)
Glucose, Bld: 197 mg/dL — ABNORMAL HIGH (ref 70–99)
Potassium: 4.2 mEq/L (ref 3.7–5.3)
SODIUM: 139 meq/L (ref 137–147)

## 2013-02-28 LAB — CBC
HCT: 30.2 % — ABNORMAL LOW (ref 39.0–52.0)
HEMOGLOBIN: 10 g/dL — AB (ref 13.0–17.0)
MCH: 30.3 pg (ref 26.0–34.0)
MCHC: 33.1 g/dL (ref 30.0–36.0)
MCV: 91.5 fL (ref 78.0–100.0)
Platelets: 132 10*3/uL — ABNORMAL LOW (ref 150–400)
RBC: 3.3 MIL/uL — AB (ref 4.22–5.81)
RDW: 14.1 % (ref 11.5–15.5)
WBC: 8.5 10*3/uL (ref 4.0–10.5)

## 2013-02-28 LAB — GLUCOSE, CAPILLARY
GLUCOSE-CAPILLARY: 114 mg/dL — AB (ref 70–99)
GLUCOSE-CAPILLARY: 124 mg/dL — AB (ref 70–99)
GLUCOSE-CAPILLARY: 164 mg/dL — AB (ref 70–99)
Glucose-Capillary: 195 mg/dL — ABNORMAL HIGH (ref 70–99)
Glucose-Capillary: 112 mg/dL — ABNORMAL HIGH (ref 70–99)

## 2013-02-28 MED ORDER — FUROSEMIDE 40 MG PO TABS
40.0000 mg | ORAL_TABLET | Freq: Every day | ORAL | Status: DC
  Administered 2013-03-01 – 2013-03-02 (×3): 40 mg via ORAL
  Filled 2013-02-28 (×3): qty 1

## 2013-02-28 NOTE — Progress Notes (Signed)
Per patient, "I did not get out of bed yesterday." I ambulated patient in the room to the door and back to the chair.  Pt. Was very weak up with the walker and took small shuffling steps.  He stated that his left knee was bothering him so we turned around once he reached the door and returned to the chair.  Will attempt to ambulate in the hall again in a couple hours.

## 2013-02-28 NOTE — Progress Notes (Signed)
Patient ID: [name], male   DOB: 1934-05-20, 79 y.o.   MRN: [id number]  [facility] PROGRESS NOTE  [name] [id number] DOB: 13-Jun-1934 DOA: 02/22/2013 PCP: Tawanna Solo, MD  Brief narrative: 79 year old gentleman with past medical history of bladder cancer and most recently (12/31/2012) had TURBT who presented to [facility] ED 02/22/2013 with blood in urine for past few days prior to this admission in addition to urinary incontinence. His wife also reported he was having shortness of breath on exertion which specifically started 5 days prior to this admission. He also had cough but non productive. No fevers or chills.   In ED, BP was 149/75, HR 56 and T max 97.3 F, O2 saturation 96% on room air. He had no complaints of pain but he continue to have blood in the urine and lower extremity swelling. His CBC revealed hemoglobin of 9.1 and BMP revealed creatinine of 2.84. BNP was elevated at 5539. CXR showed consolidation in left lung base.  Further evaluation included MRI thoracic spine which was significant for new right hydroureteronephrosis, with ureteral dilatation; also seen was advanced spinal canal stenosis at L4-L5.   Assessment/Plan:  Principal Problem:  Acute blood anemia due to hematuria  - in pt with bladder cancer and recent TURBT  - urology consulted w/ no recommendation for intervention  - avoid NSAID's   - hemoglobin stable9.5-10.5 w/o transfusion  -  CBC daily   Active Problems:  Elevated proBNP  - at 5539 on admission  - 2 D ECHO with normal EF and grade I diastolic dysfunction  - 1 more day holding lasix given Cr stabilizing. Resuming home dose lasix in AM at 40mg  daily PO - weight is up slightly today, but given renal insuff noted, will hold on lasix one more day and be cautious w/ its resumption  - currently clinically compensated   Questionable LL infiltrate on CXR  - on emperic levaquin day 6/7   Hyperkalemia  - potassium 5.2 on the admission   - potassium now WNL   AoCKD, stage IV  - creatinine 2.14 in 03/2012  - Cr now seems to have plateaud at 3.5 in setting of overaggressive diuresis. Using caution w/ diuresis at this time, currently medication is held, will resume in AM at home dose -  Hydronephrosis on MRI this admission. Will hold lasix 40mg  IV today, monitor in AM, consider repeat renal u/s if needed given hydro on prior MRI   Right hydronephrosis  - appreciate GU consult; spoke with Dr. Tresa Moore  - no intervention indicated at this time  - has urine output   Spinal canal stenosis  - neurosurgery curbsided, Dr. Ronnald Ramp said if there is no focal neurologic weakness or back pain then intervention is not warranted  - PT eval ordered - recommended SNF and placement in progress for monday  Type I DM  - continue Lantus 9 units daily and sliding scale insulin  - FSBS remain in 100s   HYPERLIPIDEMIA  - may continue statin therapy   GOUT  - continue allopurinol   HYPERTENSION  - continue norvasc 10 mg daily, hydralazine 25 mg TID and metoprolol 100 mg daily   Bladder cancer  - no other recommendations per GU   Code Status: DNR/DNI  Family Communication: spoke with pt wife over the phone [phone]  Disposition Plan: SW to assist D/C plan to [facility] Monday   Consultants:  Urology  Neurosurgery - phone call only  Procedures:  None  Antibiotics:  Levaquin 02/23/2013 --  Velna Hatchet, MD  [facility] Pager [phone]  If 7PM-7AM, please contact night-coverage [email or web address] Password [email or web address] 02/28/2013, 11:25 AM   LOS: 6 days   HPI/Subjective: No events overnight. Ready to walk w/ assist today. Feeling much better   Objective: Filed Vitals:   02/27/13 0503 02/27/13 1403 02/27/13 2223 02/28/13 0521  BP: 147/55 140/52 121/44 148/48  Pulse: 71 72 68 74  Temp: 98.8 F (37.1 C) 98 F (36.7 C) 98.3 F (36.8 C) 97.9 F (36.6 C)  TempSrc: Oral Oral Oral Oral  Resp: 16  12 20   Height:      Weight: 61.8 kg  (136 lb 3.9 oz)   62.324 kg (137 lb 6.4 oz)  SpO2: 100% 95% 96% 95%    Intake/Output Summary (Last 24 hours) at 02/28/13 1125 Last data filed at 02/28/13 1026  Gross per 24 hour  Intake    480 ml  Output   1100 ml  Net   -620 ml    Exam:   General:  Pt is alert, follows commands appropriately, not in acute distress  Cardiovascular: Regular rate and rhythm, S1/S2, no murmurs, no rubs, no gallops  Respiratory: Clear to auscultation bilaterally, no wheezing, no crackles, no rhonchi  Abdomen: Soft, non tender, non distended, bowel sounds present, no guarding  Extremities: No edema, pulses DP and PT palpable bilaterally  Neuro: Grossly nonfocal  Data Reviewed: Basic Metabolic Panel:  Recent Labs Lab 02/22/13 1722 02/23/13 0900 02/25/13 0423 02/27/13 0400 02/28/13 0810  NA 138 141 140 139 139  K 5.2* 4.5 3.8 3.8 4.2  CL 110 110 103 100 99  CO2 16* 19 24 25 24   GLUCOSE 167* 199* 84 144* 197*  BUN 109* 99* 96* 111* 111*  CREATININE 2.99* 2.84* 3.08* 3.56* 3.54*  CALCIUM 9.0 8.6 8.6 8.5 8.9   Liver Function Tests:  Recent Labs Lab 02/22/13 1722  AST 42*  ALT 38  ALKPHOS 73  BILITOT 0.3  PROT 5.9*  ALBUMIN 3.0*   CBC:  Recent Labs Lab 02/22/13 1722  02/23/13 0900  02/24/13 0712 02/24/13 1433 02/25/13 0423 02/27/13 0400 02/28/13 0810  WBC 6.3  --  7.1  --   --   --  8.2 8.0 8.5  NEUTROABS 4.8  --   --   --   --   --   --   --   --   HGB 8.0*  < > 10.0*  < > 10.1* 10.2* 10.2* 9.5* 10.0*  HCT 23.8*  < > 30.0*  < > 30.4* 30.3* 30.8* 28.7* 30.2*  MCV 94.4  --  92.0  --   --   --  92.2 92.0 91.5  PLT 94*  --  102*  --   --   --  103* 113* 132*  < > = values in this interval not displayed. CBG:  Recent Labs Lab 02/27/13 1238 02/27/13 1823 02/27/13 2217 02/28/13 0006 02/28/13 0709  GLUCAP 185* 194* 148* 164* 114*    Recent Results (from the past 240 hour(s))  URINE CULTURE     Status: None   Collection Time    02/22/13  5:20 PM      Result  Value Range Status   Specimen Description URINE, CLEAN CATCH   Final   Special Requests A   Final   Culture  Setup Time     Final   Value: 02/23/2013 01:38     Performed at [facility]  Count     Final   Value: 50,000 COLONIES/ML     Performed at [facility]   Culture     Final   Value: Multiple bacterial morphotypes present, none predominant. Suggest appropriate recollection if clinically indicated.     Performed at [facility]   Report Status 02/23/2013 FINAL   Final     Studies: No results found.  Scheduled Meds: . allopurinol  100 mg Oral Daily  . amLODipine  10 mg Oral Daily  . [START ON 03/01/2013] furosemide  40 mg Oral Daily  . hydrALAZINE  25 mg Oral TID  . insulin aspart  0-15 Units Subcutaneous TID WC  . insulin aspart  0-5 Units Subcutaneous QHS  . insulin glargine  9 Units Subcutaneous Daily  . levofloxacin  500 mg Oral Q48H  . metoprolol succinate  100 mg Oral Daily  . senna  1 tablet Oral BID  . simvastatin  20 mg Oral q1800  . sodium bicarbonate  650 mg Oral BID  . tamsulosin  0.4 mg Oral Daily  . vitamin B-12  1,000 mcg Oral Daily   Continuous Infusions:

## 2013-03-01 LAB — GLUCOSE, CAPILLARY
Glucose-Capillary: 136 mg/dL — ABNORMAL HIGH (ref 70–99)
Glucose-Capillary: 162 mg/dL — ABNORMAL HIGH (ref 70–99)

## 2013-03-01 MED ORDER — POTASSIUM CHLORIDE ER 10 MEQ PO TBCR: 10.0000 meq | EXTENDED_RELEASE_TABLET | Freq: Every day | ORAL | Status: DC

## 2013-03-01 MED ORDER — LEVOFLOXACIN 500 MG PO TABS: 500.0000 mg | ORAL_TABLET | ORAL | Status: DC

## 2013-03-01 NOTE — Discharge Summary (Signed)
Physician Discharge Summary  [name] UKG:[id number] DOB: [birth year] DOA: 02/22/2013  PCP: Tawanna Solo, MD  Admit date: 02/22/2013 Discharge date: 03/01/2013  Recommendations for Outpatient Follow-up:  1. please make sure you continue Levaquin for one more dose on discharge for pneumonia 2. please resume Lasix 20 mg daily and continue to monitor renal function. Creatinine was 3.5 at the time of discharge. Monitor renal function per nursing home protocol 3. please continue current blood pressure medications including Norvasc and metoprolol, hydralazine  Discharge Diagnoses:  Principal Problem:   Hematuria Active Problems:   Acute blood loss anemia   Type I (juvenile type) diabetes mellitus with renal manifestations, not stated as uncontrolled(250.41)   HYPERLIPIDEMIA   GOUT   HYPERTENSION   Bladder cancer   Hydronephrosis, right   Elevated brain natriuretic peptide (BNP) level    Discharge Condition: Medically stable for discharge to skilled nursing facility today  Diet recommendation: As tolerated  History of present illness:  79 year old gentleman with past medical history of bladder cancer and most recently (12/31/2012) had TURBT who presented to [facility] ED 02/22/2013 with blood in urine for past few days prior to this admission in addition to urinary incontinence. His wife also reported he was having shortness of breath on exertion which specifically started 5 days prior to this admission. He also had cough but non productive. No fevers or chills.  In ED, BP was 149/75, HR 56 and T max 97.3 F, O2 saturation 96% on room air. He had no complaints of pain but he continue to have blood in the urine and lower extremity swelling. His CBC revealed hemoglobin of 9.1 and BMP revealed creatinine of 2.84. BNP was elevated at 5539. CXR showed consolidation in left lung base.  Further evaluation included MRI thoracic spine which was significant for new right hydroureteronephrosis, with  ureteral dilatation; also seen was advanced spinal canal stenosis at L4-L5.   Assessment/Plan:   Principal Problem:  Acute blood anemia due to hematuria  - in pt with bladder cancer and recent TURBT  - urology consulted w/ no recommendation for intervention  - avoid NSAID's  - hemoglobin stable 9.5-10.5 w/o transfusion  - Continue to monitor hemoglobin per skilled nursing facility protocol Active Problems:  Elevated proBNP  - at 5539 on admission  - 2 D ECHO with normal EF and grade I diastolic dysfunction  - Continue low-dose Lasix, 20 mg daily and continue to monitor renal function  Questionable LL infiltrate on CXR  - on empiric levaquin which he will continue for one more dose of discharge Hyperkalemia  - potassium 5.2 on the admission  - potassium now WNL  - Because we're discharging the patient with Lasix it is reasonable to start him on low-dose potassium AoCKD, stage IV  - creatinine 2.14 in 03/2012  - Cr now seems to have plateaud at 3.5 in setting of overaggressive diuresis. Using caution w/ diuresis  - On low dose Right hydronephrosis  - appreciate GU consult; spoke with Dr. Tresa Moore  - no intervention indicated at this time  - has urine output  Spinal canal stenosis  - neurosurgery curbsided, Dr. Ronnald Ramp said if there is no focal neurologic weakness or back pain then intervention is not warranted  - PT eval ordered - recommended SNF  Type I DM  - continue Lantus 9 units daily and sliding scale insulin  HYPERLIPIDEMIA  - may continue statin therapy  GOUT  - continue allopurinol  HYPERTENSION  - continue norvasc 10 mg daily,  hydralazine 25 mg TID and metoprolol 100 mg daily  Bladder cancer  - no other recommendations per GU   Code Status: DNR/DNI  Family Communication: spoke with pt wife over the phone [phone]   Consultants:  Urology  Neurosurgery - phone call only  Procedures:  None  Antibiotics:  Levaquin 02/23/2013 to be continued for one more dose on  discharge    Signed:  Leisa Lenz, MD  [facility] 03/01/2013, 1:17 PM  Pager #: [phone]   Discharge Exam: Filed Vitals:   03/01/13 1026  BP: 120/49  Pulse: 68  Temp: 97.5 F (36.4 C)  Resp: 18   Filed Vitals:   02/28/13 1440 02/28/13 2317 03/01/13 0500 03/01/13 1026  BP: 138/54 140/47 117/89 120/49  Pulse: 69 69 73 68  Temp: 97.8 F (36.6 C) 98.1 F (36.7 C) 98.7 F (37.1 C) 97.5 F (36.4 C)  TempSrc: Oral Oral Oral Oral  Resp: 20 17 18 18   Height:      Weight:   61.598 kg (135 lb 12.8 oz)   SpO2: 96% 97% 96%     General: Pt is alert, follows commands appropriately, not in acute distress Cardiovascular: Regular rate and rhythm, S1/S2 +, no murmurs, no rubs, no gallops Respiratory: Clear to auscultation bilaterally, no wheezing, no crackles, no rhonchi Abdominal: Soft, non tender, non distended, bowel sounds +, no guarding Extremities: LE edema, no cyanosis, pulses palpable bilaterally DP and PT Neuro: Grossly nonfocal  Discharge Instructions  Discharge Orders   Future Orders Complete By Expires   Call MD for:  difficulty breathing, headache or visual disturbances  As directed    Call MD for:  persistant dizziness or light-headedness  As directed    Call MD for:  persistant nausea and vomiting  As directed    Call MD for:  severe uncontrolled pain  As directed    Diet - low sodium heart healthy  As directed    Discharge instructions  As directed    Comments:     1. please make sure you continue Levaquin for one more dose on discharge for pneumonia 2. please resume Lasix 20 mg daily and continue to monitor renal function. Creatinine was 3.5 at the time of discharge. Monitor renal function per nursing home protocol 3. please continue current blood pressure medications including Norvasc and metoprolol   Increase activity slowly  As directed        Medication List    STOP taking these medications       aspirin 81 MG tablet      TAKE these  medications       allopurinol 100 MG tablet  Commonly known as:  ZYLOPRIM  Take 100 mg by mouth daily.     amLODipine 10 MG tablet  Commonly known as:  NORVASC  Take 10 mg by mouth daily.     calcium-vitamin D 500-200 MG-UNIT per tablet  Commonly known as:  OSCAL WITH D  Take 1 tablet by mouth daily with breakfast.     cholecalciferol 1000 UNITS tablet  Commonly known as:  VITAMIN D  Take 1,000 Units by mouth daily.     furosemide 20 MG tablet  Commonly known as:  LASIX  Take 1 tablet (20 mg total) by mouth daily.     hydrALAZINE 25 MG tablet  Commonly known as:  APRESOLINE  Take 25 mg by mouth 3 (three) times daily.     insulin glargine 100 UNIT/ML injection  Commonly known as:  LANTUS  Inject 9 Units into the skin daily.     insulin lispro 100 UNIT/ML injection  Commonly known as:  HUMALOG  Inject 3-4 Units into the skin 3 (three) times daily before meals. 3 units with breakfast 3 units with lunch and 4 units at supper     levofloxacin 500 MG tablet  Commonly known as:  LEVAQUIN  Take 1 tablet (500 mg total) by mouth every other day.     metoprolol succinate 100 MG 24 hr tablet  Commonly known as:  TOPROL-XL  Take 100 mg by mouth daily. Take with or immediately following a meal.     potassium chloride 10 MEQ tablet  Commonly known as:  K-DUR  Take 1 tablet (10 mEq total) by mouth daily.     senna 8.6 MG Tabs tablet  Commonly known as:  SENOKOT  Take 1 tablet (8.6 mg total) by mouth 2 (two) times daily.     simvastatin 20 MG tablet  Commonly known as:  ZOCOR  Take 20 mg by mouth every morning.     sodium bicarbonate 650 MG tablet  Take 1 tablet (650 mg total) by mouth 2 (two) times daily.     tamsulosin 0.4 MG Caps capsule  Commonly known as:  FLOMAX  Take 0.4 mg by mouth daily.     vitamin B-12 1000 MCG tablet  Commonly known as:  CYANOCOBALAMIN  Take 1,000 mcg by mouth daily.             Follow-up Information   Schedule an appointment as soon  as possible for a visit with [facility], MD.   Specialty:  Oncology   Contact information:   [address] [place] [place] [address] [phone]       Follow up with Tawanna Solo, MD. Schedule an appointment as soon as possible for a visit in 2 weeks.   Specialty:  Family Medicine   Contact information:   [place] [address] [phone]        The results of significant diagnostics from this hospitalization (including imaging, microbiology, ancillary and laboratory) are listed below for reference.    Significant Diagnostic Studies: Dg Chest 2 View  02/22/2013   CLINICAL DATA:  Weakness, cough, hematuria  EXAM: CHEST  2 VIEW  COMPARISON:  04/01/2012  FINDINGS: Low lung volumes. There is diffuse prominence of the interstitial markings particularly in the perihilar regions. There are areas peribronchial cuffing. Focal area of consolidated density projects within the retrocardiac region on the left. The cardiac silhouette is within normal limits. The osseous structures are unremarkable.  IMPRESSION: Atelectasis versus consolidative infiltrate left lung base. A component of the effusion is also a diagnostic consideration. Interstitial infiltrate differential considerations are pulmonary edema versus an infectious or inflammatory infiltrate. Underlying component of pulmonary fibrosis is also a diagnostic consideration.   Electronically Signed   By: Margaree Mackintosh M.D.   On: 02/22/2013 17:59   Mr Lumbar Spine Wo Contrast  02/22/2013   CLINICAL DATA:  Urinary/bowel incontinence. Poor rectal tone. History bladder cancer.  EXAM: MRI LUMBAR SPINE WITHOUT CONTRAST  TECHNIQUE: Multiplanar, multisequence MR imaging was performed. No intravenous contrast was administered.  COMPARISON:  None.  FINDINGS: New right hydroureteronephrosis, with ureteral dilatation extending below the level of imaging, likely to the bladder given previous findings. There is retroperitoneal perivertebral  edematous on STIR imaging, likely reactive. Trabeculated bladder from chronic outlet obstruction. No definitive left-sided hydronephrosis, although poorly evaluated. There is urine in the  bladder, arguing against complete bilateral obstruction.  No evidence of acute fracture, spine infection, or definitive metastasis. The marrow is heterogeneous, but without focal masslike abnormality. Heterogeneity is often from chronic hypoxemia or anemia. Normal conus signal and morphology.  Congenitally narrow spinal canal with diffuse, advanced degenerative changes superimposed.  L1-L2: Disc bulging and facet/ ligament overgrowth, narrowing the AP diameter of the canal.  L2-L3: Advanced degenerative disc narrowing with posterior disc bulging and dorsal ligamentous and facet overgrowth resulting in triangular-shaped thecal sac, effacing much of the CSF. Superimposed left foraminal disc herniation causes asymmetric left foraminal stenosis.  L3-L4: Degenerative facet and ligament overgrowth. Facet spurring and leftward disc bulging narrows the left more than right foramen.  L4-L5: Advanced canal stenosis secondary to marked dorsal ligamentous thickening, facet overgrowth, and broad disc bulging. There is complete effacement of CSF. Disc and facet spurs narrow the inferior foramen bilaterally.  L5-S1:Advanced right foraminal narrowing, with no visible perineural fat, secondary to disc narrowing, disc bulging, and facet spurring.  IMPRESSION: 1. Right hydroureteronephrosis which has developed since 04/24/2012. 2. Advanced spinal canal stenosis at L4-5, secondary to congenitally narrow canal and superimposed degenerative disc and facet disease. 3. Diffuse degenerative disc and facet disease, with related stenoses described above. 4. Marrow heterogeneity, but no definitive spinal metastasis.   Electronically Signed   By: Jorje Guild M.D.   On: 02/22/2013 19:05    Microbiology: Recent Results (from the past 240 hour(s))  URINE  CULTURE     Status: None   Collection Time    02/22/13  5:20 PM      Result Value Range Status   Specimen Description URINE, CLEAN CATCH   Final   Special Requests A   Final   Culture  Setup Time     Final   Value: 02/23/2013 01:38     Performed at [facility]     Final   Value: 50,000 COLONIES/ML     Performed at [facility]   Culture     Final   Value: Multiple bacterial morphotypes present, none predominant. Suggest appropriate recollection if clinically indicated.     Performed at [facility]   Report Status 02/23/2013 FINAL   Final     Labs: Basic Metabolic Panel:  Recent Labs Lab 02/22/13 1722 02/23/13 0900 02/25/13 0423 02/27/13 0400 02/28/13 0810  NA 138 141 140 139 139  K 5.2* 4.5 3.8 3.8 4.2  CL 110 110 103 100 99  CO2 16* 19 24 25 24   GLUCOSE 167* 199* 84 144* 197*  BUN 109* 99* 96* 111* 111*  CREATININE 2.99* 2.84* 3.08* 3.56* 3.54*  CALCIUM 9.0 8.6 8.6 8.5 8.9   Liver Function Tests:  Recent Labs Lab 02/22/13 1722  AST 42*  ALT 38  ALKPHOS 73  BILITOT 0.3  PROT 5.9*  ALBUMIN 3.0*   No results found for this basename: LIPASE, AMYLASE,  in the last 168 hours No results found for this basename: AMMONIA,  in the last 168 hours CBC:  Recent Labs Lab 02/22/13 1722  02/23/13 0900  02/24/13 0712 02/24/13 1433 02/25/13 0423 02/27/13 0400 02/28/13 0810  WBC 6.3  --  7.1  --   --   --  8.2 8.0 8.5  NEUTROABS 4.8  --   --   --   --   --   --   --   --   HGB 8.0*  < > 10.0*  < > 10.1* 10.2*  10.2* 9.5* 10.0*  HCT 23.8*  < > 30.0*  < > 30.4* 30.3* 30.8* 28.7* 30.2*  MCV 94.4  --  92.0  --   --   --  92.2 92.0 91.5  PLT 94*  --  102*  --   --   --  103* 113* 132*  < > = values in this interval not displayed. Cardiac Enzymes: No results found for this basename: CKTOTAL, CKMB, CKMBINDEX, TROPONINI,  in the last 168 hours BNP: BNP (last 3 results)  Recent Labs  02/22/13 1722 02/23/13 0900  PROBNP 5556.0*  5539.0*   CBG:  Recent Labs Lab 02/28/13 1215 02/28/13 1737 02/28/13 2314 03/01/13 0756 03/01/13 1138  GLUCAP 195* 124* 112* 136* 162*    Time coordinating discharge: Over 30 minutes

## 2013-03-01 NOTE — Progress Notes (Signed)
Patient has a bed @ [facility], anticipating discharge today. CSW left message for case worker @ [employer], awaiting authorization.   Winfred Leeds, [facility] Clinical Social Worker cell #: [phone]

## 2013-03-01 NOTE — Discharge Instructions (Signed)

## 2013-03-02 LAB — GLUCOSE, CAPILLARY
GLUCOSE-CAPILLARY: 173 mg/dL — AB (ref 70–99)
Glucose-Capillary: 224 mg/dL — ABNORMAL HIGH (ref 70–99)
Glucose-Capillary: 153 mg/dL — ABNORMAL HIGH (ref 70–99)
Glucose-Capillary: 210 mg/dL — ABNORMAL HIGH (ref 70–99)

## 2013-03-02 NOTE — Progress Notes (Signed)
Physical Therapy Treatment Patient Details Name: [name] MRN: [id number] DOB: [birth year] Today's Date: 03/02/2013 Time: 1000-1034 PT Time Calculation (min): 34 min  PT Assessment / Plan / Recommendation  History of Present Illness 79 yo male admitted with hematuria, weakness, anemia. Hx of bladder cancer, gout, R foot drop, chronic LE edema   PT Comments   Pt still very weak and tolerates very little activity in small intervals. Feel like he would benefit from continued PT in SNF environment in order to increase mobility back to baseline of ambulating during day with cane and around house at Mod I level with a cane. He also was able to go up a flight to his bedroom with a cane and is not able to do this at this time.   Follow Up Recommendations  SNF     Does the patient have the potential to tolerate intense rehabilitation     Barriers to Discharge        Equipment Recommendations  None recommended by PT    Recommendations for Other Services OT consult  Frequency Min 3X/week   Progress towards PT Goals Progress towards PT goals: Progressing toward goals  Plan Current plan remains appropriate    Precautions / Restrictions Precautions Precautions: Fall Required Braces or Orthoses: Other Brace/Splint Other Brace/Splint: pt states he wears R AFO? "built into shoe". did not wear during this session Restrictions Weight Bearing Restrictions: No   Pertinent Vitals/Pain Reports no pain during session today   Mobility  Bed Mobility Bed Mobility: Supine to Sit;Sit to Supine Supine to Sit: 4: Min assist Sit to Supine: 4: Min assist Details for Bed Mobility Assistance: slow, HOB elevated, used rails Transfers Transfers: Sit to Stand;Stand to Sit Sit to Stand: 3: Mod assist (from regtualr height bed with two attempts to rise and cues for hand placement) Stand to Sit: 4: Min assist Details for Transfer Assistance: stand to sit pt was so fatugued was unable to control descent  as he should for safety. Ambulation/Gait Ambulation/Gait Assistance: 4: Min assist Ambulation Distance (Feet): 65 Feet Assistive device: Rolling walker Ambulation/Gait Assistance Details: very labored, tremors and unsteady. fatigued very quickly and had to take 2 standing brakes. relies on RW greatly. Gait Pattern: Step-through pattern;Trunk flexed;Decreased stride length Gait velocity: decreased    Exercises     PT Diagnosis:    PT Problem List:   PT Treatment Interventions:     PT Goals (current goals can now be found in the care plan section) Acute Rehab PT Goals Patient Stated Goal: to get stronger adn be able to get around my house again with just my cane PT Goal Formulation: With patient Time For Goal Achievement: 03/10/13 Potential to Achieve Goals: Good  Visit Information  Last PT Received On: 03/02/13 Assistance Needed: +1 History of Present Illness: 79 yo male admitted with hematuria, weakness, anemia. Hx of bladder cancer, gout, R foot drop, chronic LE edema    Subjective Data  Patient Stated Goal: to get stronger adn be able to get around my house again with just my cane   Cognition  Cognition Arousal/Alertness: Awake/alert Behavior During Therapy: WFL for tasks assessed/performed Overall Cognitive Status: Within Functional Limits for tasks assessed    Balance     End of Session PT - End of Session Equipment Utilized During Treatment: Gait belt Activity Tolerance: Patient limited by fatigue;Patient tolerated treatment well (pt stated he was so tired and not sure why except that he didn't sleep well last night) Patient  left: in bed;with call bell/phone within reach;with bed alarm set Nurse Communication: Mobility status   GP     Clide Dales 03/02/2013, 10:36 AM Clide Dales, PT Pager: [phone] 03/02/2013

## 2013-03-02 NOTE — Progress Notes (Addendum)
CSW obtained [facility] authorization. Patient is set to discharge to [facility]. Patient & wife aware. Discharge packet in [place]. [facility] called for transport to pickup @ 2p (Service Request Id: [id number]).   Clinical Social Work Department CLINICAL SOCIAL WORK PLACEMENT NOTE 03/02/2013  Patient:  [name], [name]  Account Number:  [id number] Admit date:  02/22/2013  Clinical Social Worker:  Renold Genta  Date/time:  02/26/2013 01:31 PM  Clinical Social Work is seeking post-discharge placement for this patient at the following level of care:   SKILLED NURSING   (*CSW will update this form in Epic as items are completed)   02/26/2013  Patient/family provided with [place] Department of Clinical Social Work's list of facilities offering this level of care within the geographic area requested by the patient (or if unable, by the patient's family).  02/26/2013  Patient/family informed of their freedom to choose among providers that offer the needed level of care, that participate in Medicare, Medicaid or managed care program needed by the patient, have an available bed and are willing to accept the patient.  02/26/2013  Patient/family informed of [facility]' ownership interest in [facility], as well as of the fact that they are under no obligation to receive care at this facility.  PASARR submitted to EDS on 02/26/2013 PASARR number received from EDS on 02/26/2013  FL2 transmitted to all facilities in geographic area requested by pt/family on  02/26/2013 FL2 transmitted to all facilities within larger geographic area on   Patient informed that his/her managed care company has contracts with or will negotiate with  certain facilities, including the following:   [facility]     Patient/family informed of bed offers received:  02/26/2013 Patient chooses bed at [place] Physician recommends and patient chooses bed at    Patient to be  transferred to [place] on  03/02/2013 Patient to be transferred to facility by [facility]  The following physician request were entered in Epic:   Additional Comments:   Winfred Leeds, [place] Worker cell #: [phone]

## 2013-03-02 NOTE — Progress Notes (Signed)
[facility] PROGRESS NOTE  Abb Gobert Putman OHY:[id number] DOB: 24-May-1934 DOA: 02/22/2013 PCP: Tawanna Solo, MD  Pt seen and examined at the bedside. He is stable for discharge to SNF today. No changes in management since 03/01/2012. Please refer to D/C summary done 03/01/2013.   Leisa Lenz [facility] [phone]

## 2013-03-08 ENCOUNTER — Non-Acute Institutional Stay (SKILLED_NURSING_FACILITY): Payer: Medicare Other | Admitting: Internal Medicine

## 2013-03-08 DIAGNOSIS — D62 Acute posthemorrhagic anemia: Secondary | ICD-10-CM

## 2013-03-08 DIAGNOSIS — N184 Chronic kidney disease, stage 4 (severe): Secondary | ICD-10-CM

## 2013-03-08 DIAGNOSIS — E1029 Type 1 diabetes mellitus with other diabetic kidney complication: Secondary | ICD-10-CM

## 2013-03-08 DIAGNOSIS — C679 Malignant neoplasm of bladder, unspecified: Secondary | ICD-10-CM

## 2013-03-09 ENCOUNTER — Non-Acute Institutional Stay (SKILLED_NURSING_FACILITY): Payer: Medicare Other | Admitting: Family

## 2013-03-09 ENCOUNTER — Encounter: Payer: Self-pay | Admitting: Family

## 2013-03-09 DIAGNOSIS — L259 Unspecified contact dermatitis, unspecified cause: Secondary | ICD-10-CM

## 2013-03-09 NOTE — Progress Notes (Signed)
Patient ID: [name], male   DOB: August 30, 1934, 79 y.o.   MRN: [id number]  Date: 03/09/13 Facility: [facility]  Code Status:  DNR  Chief Complaint  Patient presents with  . Acute Visit    Rash    HPI: Pt reports rash x 2 days.  Pt reports rash is located on chest, back, and BUE; he characterizes rash as erythematous and itchy.  Pt denies N, V, D, Fever, chills, swelling-throat, nasal congestion, and or HA. Pt however, endorses a cough that is improving which is unrelated to the onset of the rash. Pt reports change in grooming habits; pt reports lotion supplied by facility was placed on his chest/ back/ and BUE approximately 2-3 days ago prior to the onset of the rash.  Pt denies changed in laundering routine-wife washes clothes while in facility. Pt denies using benadryl or other treatment to alleviate rash and subsequent discomfort.      No Known Allergies    Medication List       This list is accurate as of: 03/09/13  2:27 PM.  Always use your most recent med list.               allopurinol 100 MG tablet  Commonly known as:  ZYLOPRIM  Take 100 mg by mouth daily.     amLODipine 10 MG tablet  Commonly known as:  NORVASC  Take 10 mg by mouth daily.     calcium-vitamin D 500-200 MG-UNIT per tablet  Commonly known as:  OSCAL WITH D  Take 1 tablet by mouth daily with breakfast.     cholecalciferol 1000 UNITS tablet  Commonly known as:  VITAMIN D  Take 1,000 Units by mouth daily.     furosemide 20 MG tablet  Commonly known as:  LASIX  Take 1 tablet (20 mg total) by mouth daily.     hydrALAZINE 25 MG tablet  Commonly known as:  APRESOLINE  Take 25 mg by mouth 3 (three) times daily.     insulin glargine 100 UNIT/ML injection  Commonly known as:  LANTUS  Inject 9 Units into the skin daily.     insulin lispro 100 UNIT/ML injection  Commonly known as:  HUMALOG  Inject 3-4 Units into the skin 3 (three) times daily before meals. 3 units with breakfast 3 units  with lunch and 4 units at supper     levofloxacin 500 MG tablet  Commonly known as:  LEVAQUIN  Take 1 tablet (500 mg total) by mouth every other day.     metoprolol succinate 100 MG 24 hr tablet  Commonly known as:  TOPROL-XL  Take 100 mg by mouth daily. Take with or immediately following a meal.     potassium chloride 10 MEQ tablet  Commonly known as:  K-DUR  Take 1 tablet (10 mEq total) by mouth daily.     senna 8.6 MG Tabs tablet  Commonly known as:  SENOKOT  Take 1 tablet (8.6 mg total) by mouth 2 (two) times daily.     simvastatin 20 MG tablet  Commonly known as:  ZOCOR  Take 20 mg by mouth every morning.     sodium bicarbonate 650 MG tablet  Take 1 tablet (650 mg total) by mouth 2 (two) times daily.     tamsulosin 0.4 MG Caps capsule  Commonly known as:  FLOMAX  Take 0.4 mg by mouth daily.     vitamin B-12 1000 MCG tablet  Commonly known as:  CYANOCOBALAMIN  Take 1,000 mcg by mouth daily.        DATA REVIEWED   Laboratory Studies: 02/28/13-Na 139, K 4.2, Cl 99, BUN 111, Creatinine 3.54, Calcium 8.9, WBC 8.5, H/H 10/30, Plate 132     Past Medical History  Diagnosis Date  . Hypertension   . Heart murmur   . Gout     no attack in years  . Chronic kidney disease     RENAL INSUFFICIENCY  . Diabetes mellitus   . Bladder cancer   . Testicular cancer     1965  . Hypercholesteremia   . Osteoporosis   . Right foot drop      Past Surgical History  Procedure Laterality Date  . Orchiectomy  1965    right  . Turbt    . Fx femur  2006  . Cystoscopy w/ retrogrades  02/15/2011    Procedure: CYSTOSCOPY WITH RETROGRADE PYELOGRAM;  Surgeon: Franchot Gallo, MD;  Location: [facility];  Service: Urology;  Laterality: Left;  Cystoscopy, Transurethral Resection of Bladder Tumor, Left Retrograde Pyelograms    . Transurethral resection of bladder tumor  02/15/2011    Procedure: TRANSURETHRAL RESECTION OF BLADDER TUMOR (TURBT);  Surgeon: Franchot Gallo, MD;  Location:  [facility];  Service: Urology;  Laterality: N/A;  . Cystoscopy w/ retrogrades Bilateral 04/06/2012    Procedure: CYSTOSCOPY WITH RETROGRADE PYELOGRAM;  Surgeon: Franchot Gallo, MD;  Location: [facility];  Service: Urology;  Laterality: Bilateral;  CYSTO, TURBT, BILATERAL RETROGRADES AND INSTILLATION Mitomycin C  . Transurethral resection of bladder tumor N/A 04/06/2012    Procedure: TRANSURETHRAL RESECTION OF BLADDER TUMOR (TURBT);  Surgeon: Franchot Gallo, MD;  Location: [facility];  Service: Urology;  Laterality: N/A;  . Tonsillectomy  as child  . Cataract extraction Right   . Transurethral resection of bladder tumor with gyrus (turbt-gyrus) N/A 12/31/2012    Procedure: TRANSURETHRAL RESECTION OF BLADDER TUMOR WITH GYRUS (TURBT-GYRUS);  Surgeon: Franchot Gallo, MD;  Location: [facility];  Service: Urology;  Laterality: N/A;  . Transurethral resection of prostate N/A 12/31/2012    Procedure: TRANSURETHRAL RESECTION OF THE PROSTATE WITH GYRUS INSTRUMENTS;  Surgeon: Franchot Gallo, MD;  Location: [facility];  Service: Urology;  Laterality: N/A;      Review of Systems  Constitutional: Negative.   Respiratory: Positive for cough.        X 2 weeks, nonproductive , and improving  Cardiovascular: Negative.   Skin: Positive for itching and rash.  Neurological: Negative.      Physical Exam Filed Vitals:   03/09/13 1420  BP: 135/59  Pulse: 62  Temp: 97.1 F (36.2 C)  Resp: 18   There is no weight on file to calculate BMI. Physical Exam  Constitutional: He is oriented to person, place, and time.  Cardiovascular: Normal rate and regular rhythm.   Pulmonary/Chest: Effort normal and breath sounds normal.  Neurological: He is alert and oriented to person, place, and time.  Skin: Skin is warm and dry.       ASSESSMENT/PLAN  Contact Dermatitis-Triamcinolone 0.025 % bid to affected  X 2 weeks area-Benadryl 25 mg po x 1. D/C use of lotion  Follow up: prn

## 2013-03-10 DIAGNOSIS — N184 Chronic kidney disease, stage 4 (severe): Secondary | ICD-10-CM | POA: Insufficient documentation

## 2013-03-10 NOTE — Progress Notes (Signed)
HISTORY & PHYSICAL  DATE: 03/08/2013   FACILITY: [facility]  LEVEL OF CARE: SNF (31)  ALLERGIES:  No Known Allergies  CHIEF COMPLAINT:  Manage acute blood loss anemia, DM & CKD  HISTORY OF PRESENT ILLNESS: pt is 79 yo Caucasian male who was hospitalized for hematuria.  After hospitalization he was admitted to this facility for SNF rehab.  ANEMIA: The anemia has been stable. The patient denies fatigue, melena or hematochezia.  Last Hb 9.5-10.5. Currently not on Fe.  DM:pt's DM remains stable.  Pt denies polyuria, polydipsia, polyphagia, changes in vision or hypoglycemic episodes.  No complications noted from the medication presently being used.  Last hemoglobin A1c is:not available.  CHRONIC KIDNEY DISEASE: The patient's chronic kidney disease remains stable.  Patient denies increasing lower extremity swelling or confusion. Last BUN and creatinine are: 111/3.54.  PAST MEDICAL HISTORY :  Past Medical History  Diagnosis Date  . Hypertension   . Heart murmur   . Gout     no attack in years  . Chronic kidney disease     RENAL INSUFFICIENCY  . Diabetes mellitus   . Bladder cancer   . Testicular cancer     1965  . Hypercholesteremia   . Osteoporosis   . Right foot drop     PAST SURGICAL HISTORY: Past Surgical History  Procedure Laterality Date  . Orchiectomy  1965    right  . Turbt    . Fx femur  2006  . Cystoscopy w/ retrogrades  02/15/2011    Procedure: CYSTOSCOPY WITH RETROGRADE PYELOGRAM;  Surgeon: Franchot Gallo, MD;  Location: [facility];  Service: Urology;  Laterality: Left;  Cystoscopy, Transurethral Resection of Bladder Tumor, Left Retrograde Pyelograms    . Transurethral resection of bladder tumor  02/15/2011    Procedure: TRANSURETHRAL RESECTION OF BLADDER TUMOR (TURBT);  Surgeon: Franchot Gallo, MD;  Location: [facility];  Service: Urology;  Laterality: N/A;  . Cystoscopy w/ retrogrades Bilateral 04/06/2012    Procedure:  CYSTOSCOPY WITH RETROGRADE PYELOGRAM;  Surgeon: Franchot Gallo, MD;  Location: [facility];  Service: Urology;  Laterality: Bilateral;  CYSTO, TURBT, BILATERAL RETROGRADES AND INSTILLATION Mitomycin C  . Transurethral resection of bladder tumor N/A 04/06/2012    Procedure: TRANSURETHRAL RESECTION OF BLADDER TUMOR (TURBT);  Surgeon: Franchot Gallo, MD;  Location: [facility];  Service: Urology;  Laterality: N/A;  . Tonsillectomy  as child  . Cataract extraction Right   . Transurethral resection of bladder tumor with gyrus (turbt-gyrus) N/A 12/31/2012    Procedure: TRANSURETHRAL RESECTION OF BLADDER TUMOR WITH GYRUS (TURBT-GYRUS);  Surgeon: Franchot Gallo, MD;  Location: [facility];  Service: Urology;  Laterality: N/A;  . Transurethral resection of prostate N/A 12/31/2012    Procedure: TRANSURETHRAL RESECTION OF THE PROSTATE WITH GYRUS INSTRUMENTS;  Surgeon: Franchot Gallo, MD;  Location: [facility];  Service: Urology;  Laterality: N/A;    SOCIAL HISTORY:  reports that he quit smoking about 45 years ago. His smoking use included Cigarettes. He smoked 0.00 packs per day. He has never used smokeless tobacco. He reports that he does not drink alcohol or use illicit drugs.  FAMILY HISTORY:  Family History  Problem Relation Age of Onset  . Diabetes Father   . Diabetes Sister     CURRENT MEDICATIONS: Reviewed per [facility]  REVIEW OF SYSTEMS:  BLE swelling, See HPI otherwise 14 point ROS is negative.  PHYSICAL EXAMINATION  VS:  T97.1  P62       RR 18     BP 136/62       POX%        WT (Lb) 138.8  GENERAL: no acute distress, normal body habitus EYES: conjunctivae normal, sclerae normal, normal eye lids MOUTH/THROAT: lips without lesions,no lesions in the mouth,tongue is without lesions,uvula elevates in midline NECK: supple, trachea midline, no neck masses, no thyroid tenderness, no thyromegaly LYMPHATICS: no LAN in the neck, no supraclavicular LAN RESPIRATORY: breathing is even & unlabored, BS  CTAB CARDIAC: RRR, no murmur,no extra heart sounds, +2 BLE pitting edema GI:  ABDOMEN: abdomen soft, normal BS, no masses, no tenderness  LIVER/SPLEEN: no hepatomegaly, no splenomegaly MUSCULOSKELETAL: HEAD: normal to inspection & palpation BACK: no kyphosis, scoliosis or spinal processes tenderness EXTREMITIES: LEFT UPPER EXTREMITY: full range of motion, normal strength & tone RIGHT UPPER EXTREMITY:  full range of motion, normal strength & tone LEFT LOWER EXTREMITY:  full range of motion, normal strength & tone RIGHT LOWER EXTREMITY:  minimal range of motion, decreased strength & tone PSYCHIATRIC: the patient is alert & oriented to person, affect & behavior appropriate  LABS/RADIOLOGY:  Labs reviewed: Basic Metabolic Panel:  Recent Labs  02/25/13 0423 02/27/13 0400 02/28/13 0810  NA 140 139 139  K 3.8 3.8 4.2  CL 103 100 99  CO2 24 25 24   GLUCOSE 84 144* 197*  BUN 96* 111* 111*  CREATININE 3.08* 3.56* 3.54*  CALCIUM 8.6 8.5 8.9   Liver Function Tests:  Recent Labs  02/22/13 1722  AST 42*  ALT 38  ALKPHOS 73  BILITOT 0.3  PROT 5.9*  ALBUMIN 3.0*   CBC:  Recent Labs  02/22/13 1722  02/25/13 0423 02/27/13 0400 02/28/13 0810  WBC 6.3  < > 8.2 8.0 8.5  NEUTROABS 4.8  --   --   --   --   HGB 8.0*  < > 10.2* 9.5* 10.0*  HCT 23.8*  < > 30.8* 28.7* 30.2*  MCV 94.4  < > 92.2 92.0 91.5  PLT 94*  < > 103* 113* 132*  < > = values in this interval not displayed.  CBG:  Recent Labs  03/01/13 2227 03/02/13 0743 03/02/13 1153  GLUCAP 153* 210* 173*   Urine Cx-insignificant growth  EXAM: CHEST  2 VIEW   COMPARISON:  04/01/2012   FINDINGS: Low lung volumes. There is diffuse prominence of the interstitial markings particularly in the perihilar regions. There are areas peribronchial cuffing. Focal area of consolidated density projects within the retrocardiac region on the left. The cardiac silhouette is within normal limits. The osseous structures are  unremarkable.   IMPRESSION: Atelectasis versus consolidative infiltrate left lung base. A component of the effusion is also a diagnostic consideration. Interstitial infiltrate differential considerations are pulmonary edema versus an infectious or inflammatory infiltrate. Underlying component of pulmonary fibrosis is also a diagnostic consideration. EXAM: MRI LUMBAR SPINE WITHOUT CONTRAST   TECHNIQUE: Multiplanar, multisequence MR imaging was performed. No intravenous contrast was administered.   COMPARISON:  None.   FINDINGS: New right hydroureteronephrosis, with ureteral dilatation extending below the level of imaging, likely to the bladder given previous findings. There is retroperitoneal perivertebral edematous on STIR imaging, likely reactive. Trabeculated bladder from chronic outlet obstruction. No definitive left-sided hydronephrosis, although poorly evaluated. There is urine in the bladder, arguing against complete bilateral obstruction.   No evidence of acute fracture, spine infection, or definitive metastasis. The marrow is heterogeneous, but without focal masslike abnormality. Heterogeneity is often  from chronic hypoxemia or anemia. Normal conus signal and morphology.   Congenitally narrow spinal canal with diffuse, advanced degenerative changes superimposed.   L1-L2: Disc bulging and facet/ ligament overgrowth, narrowing the AP diameter of the canal.   L2-L3: Advanced degenerative disc narrowing with posterior disc bulging and dorsal ligamentous and facet overgrowth resulting in triangular-shaped thecal sac, effacing much of the CSF. Superimposed left foraminal disc herniation causes asymmetric left foraminal stenosis.   L3-L4: Degenerative facet and ligament overgrowth. Facet spurring and leftward disc bulging narrows the left more than right foramen.   L4-L5: Advanced canal stenosis secondary to marked dorsal ligamentous thickening, facet overgrowth, and  broad disc bulging. There is complete effacement of CSF. Disc and facet spurs narrow the inferior foramen bilaterally.   L5-S1:Advanced right foraminal narrowing, with no visible perineural fat, secondary to disc narrowing, disc bulging, and facet spurring.   IMPRESSION: 1. Right hydroureteronephrosis which has developed since 04/24/2012. 2. Advanced spinal canal stenosis at L4-5, secondary to congenitally narrow canal and superimposed degenerative disc and facet disease. 3. Diffuse degenerative disc and facet disease, with related stenoses described above. 4. Marrow heterogeneity, but no definitive spinal metastasis.     ASSESSMENT/PLAN:  Acute blood loss anemia- Recheck. DM with renal complication- cont current meds CKD IV-recheck Bladder Ca-s/p TURBT R hydronephrosis-no further intervention per urology Spinal stenosis-no intervention per neurosurgeory Check cbc & bmp  I have reviewed patient's medical records received at admission/from hospitalization.  CPT CODE: 34356

## 2013-03-15 ENCOUNTER — Non-Acute Institutional Stay (SKILLED_NURSING_FACILITY): Payer: Medicare Other | Admitting: Internal Medicine

## 2013-03-15 DIAGNOSIS — D631 Anemia in chronic kidney disease: Secondary | ICD-10-CM | POA: Insufficient documentation

## 2013-03-15 DIAGNOSIS — N184 Chronic kidney disease, stage 4 (severe): Secondary | ICD-10-CM

## 2013-03-15 DIAGNOSIS — D696 Thrombocytopenia, unspecified: Secondary | ICD-10-CM | POA: Insufficient documentation

## 2013-03-15 DIAGNOSIS — N039 Chronic nephritic syndrome with unspecified morphologic changes: Secondary | ICD-10-CM

## 2013-03-15 DIAGNOSIS — R609 Edema, unspecified: Secondary | ICD-10-CM | POA: Insufficient documentation

## 2013-03-15 NOTE — Progress Notes (Signed)
         PROGRESS NOTE  DATE: 03/15/2013  FACILITY:  [facility]  LEVEL OF CARE: SNF (31)  Acute Visit  CHIEF COMPLAINT:  Manage right lower extremity swelling, anemia of chronic kidney disease and chronic kidney disease stage IV  HISTORY OF PRESENT ILLNESS: I was requested by the staff to assess the patient regarding above problem(s):  EDEMA: The patient's edema is unstable. Patient complains of increasing right lower extremity swelling, but denies calf pain, chest pain or shortness of breath. No complications reported from the medications currently being used. Symptoms have been present for a couple of days. Patient cannot identify precipitating or alleviating factors. There is no temporal relationship.  ANEMIA: The anemia is unstable. The patient denies fatigue, melena or hematochezia. No complications from the medications currently being used. On 03-11-13 hemoglobin 8.3, MCV 93. On 02-28-13 hemoglobin 10 MCV 91.5.  CHRONIC KIDNEY DISEASE: The patient's chronic kidney disease remains stable.  Patient complains of increasing right lower extremity swelling, but denies confusion. Last BUN and creatinine are: 114/3.2 on 03-11-13. On 02-28-13 BUN 111/creatinine 3.54.  PAST MEDICAL HISTORY : Reviewed.  No changes.  CURRENT MEDICATIONS: Reviewed per [facility]  REVIEW OF SYSTEMS:  GENERAL: no change in appetite, no fatigue, no weight changes, no fever, chills or weakness RESPIRATORY: no cough, SOB, DOE,, wheezing, hemoptysis CARDIAC: no chest pain,  or palpitations, complains of increasing right lower extremity swelling GI: no abdominal pain, diarrhea, constipation, heart burn, nausea or vomiting  PHYSICAL EXAMINATION  GENERAL: no acute distress, normal body habitus EYES: conjunctivae normal, sclerae normal, normal eye lids NECK: supple, trachea midline, no neck masses, no thyroid tenderness, no thyromegaly LYMPHATICS: no LAN in the neck, no supraclavicular  LAN RESPIRATORY: breathing is even & unlabored, BS CTAB CARDIAC: RRR, no murmur,no extra heart sounds, right lower ext +3 edema, left lower ext +2 edema GI: abdomen soft, normal BS, no masses, no tenderness, no hepatomegaly, no splenomegaly PSYCHIATRIC: the patient is alert & oriented to person, affect & behavior appropriate  LABS/RADIOLOGY:  03-11-13 platelets 133 02-28-13 platelets 132  ASSESSMENT/PLAN:  Right lower extremity edema-worsening problem. Obtain venous Doppler ultrasound. Anemia of chronic kidney disease-unstable problem. Hemoglobin declined. Will reassess. Chronic kidney disease stage IV-renal functions improved Thrombocytopenia-stable  CPT CODE: 75170

## 2013-03-19 ENCOUNTER — Non-Acute Institutional Stay (SKILLED_NURSING_FACILITY): Payer: Medicare Other | Admitting: Internal Medicine

## 2013-03-19 DIAGNOSIS — E875 Hyperkalemia: Secondary | ICD-10-CM

## 2013-03-19 DIAGNOSIS — N184 Chronic kidney disease, stage 4 (severe): Secondary | ICD-10-CM

## 2013-03-19 NOTE — Progress Notes (Signed)
Patient ID: [name], male   DOB: Jun 01, 1934, 79 y.o.   MRN: [id number] Facility; [facility] Chief complaint; review of mild hyperkalemia History; this is a gentleman with known chronic renal failure. He normally has a BUN and creatinine in the 111/3.54 range. He came to [place] after his stay at [facility] with hematuria, pneumonia. The patient has a history of bladder cancer having a recent resection of this. He came here on potassium 10 mEq which he appears to be still be on. On 1/15 his potassium was 4.4 BUN of 114 creatinine of 3.2 and yesterday this was 98 3.4 with a potassium of 5.5. The patient states he is voiding well had some hematuria 2 or 3 days ago. However he denies any dysuria  Medication list is reviewed his Lasix was discontinued on 1/15 although his potassium was not discontinued. He is also being followed by [facility] [Dr. Powell] per  Physical examination Respiratory clear entry bilaterally Cardiac he does not appear to be dehydrated or fluid volume expanded and no gallops JVP nonelevated GU bladder is not distended there is no evidence of obstruction   Impression/plan #1 hyperkalemia high think this is her continued potassium use in the face of the discontinued Lasix. All discontinue his potassium give him a single dose of Kayexalate. There is no evidence that this is a progressive renal issue at this point

## 2013-03-29 ENCOUNTER — Non-Acute Institutional Stay (SKILLED_NURSING_FACILITY): Payer: Medicare Other | Admitting: Internal Medicine

## 2013-03-29 DIAGNOSIS — D631 Anemia in chronic kidney disease: Secondary | ICD-10-CM

## 2013-03-29 DIAGNOSIS — R197 Diarrhea, unspecified: Secondary | ICD-10-CM | POA: Insufficient documentation

## 2013-03-29 DIAGNOSIS — N184 Chronic kidney disease, stage 4 (severe): Secondary | ICD-10-CM

## 2013-03-29 DIAGNOSIS — N039 Chronic nephritic syndrome with unspecified morphologic changes: Secondary | ICD-10-CM

## 2013-03-29 NOTE — Progress Notes (Signed)
         PROGRESS NOTE  DATE: 03/29/2013  FACILITY:  [facility]  LEVEL OF CARE: SNF (31)  Acute Visit  CHIEF COMPLAINT:  Manage diarrhea, anemia of chronic kidney disease and chronic kidney disease  HISTORY OF PRESENT ILLNESS: I was requested by the staff to assess the patient regarding above problem(s):  DIARRHEA: New problem. The patient is complaining of new onset diarrhea since this morning. Patient denies melena, hematochezia, hematemesis, nausea, vomiting or abdominal pain. Precipitating or alleviating factors cannot be identified. There is no temporal relationship.  ANEMIA: The anemia has been stable. The patient denies fatigue, melena or hematochezia. No complications from the medications currently being used. On 03-23-13 hemoglobin 8.8, on 03-11-13 hemoglobin was 8.3.  CHRONIC KIDNEY DISEASE: The patient's chronic kidney disease remains stable.  Patient denies increasing lower extremity swelling or confusion. Last BUN and creatinine are: On 03-23-13 BUN 81, creatinine 3.2, on 03-11-13 BUN 98, creatinine 3.4.  PAST MEDICAL HISTORY : Reviewed.  No changes.  CURRENT MEDICATIONS: Reviewed per [facility]  REVIEW OF SYSTEMS:  GENERAL: no change in appetite, no fatigue, no weight changes, no fever, chills or weakness RESPIRATORY: no cough, SOB, DOE,, wheezing, hemoptysis CARDIAC: no chest pain, or palpitations, complains of chronic lower extremity swelling GI: no abdominal pain, constipation, heart burn, nausea or vomiting, complains of new onset diarrhea  PHYSICAL EXAMINATION  GENERAL: no acute distress, normal body habitus EYES: conjunctivae normal, sclerae normal, normal eye lids NECK: supple, trachea midline, no neck masses, no thyroid tenderness, no thyromegaly LYMPHATICS: no LAN in the neck, no supraclavicular LAN RESPIRATORY: breathing is even & unlabored, BS CTAB CARDIAC: RRR, no murmur,no extra heart sounds, +2 bilateral lower extremity edema GI:  abdomen soft, no BS, no masses, no tenderness, no hepatomegaly, no splenomegaly PSYCHIATRIC: the patient is alert & oriented to person, affect & behavior appropriate  LABS/RADIOLOGY: See history of present illness  ASSESSMENT/PLAN:  Diarrhea-new onset problem. Send stool for C. difficile toxin x3. Start Imodium as needed protocol. Anemia of chronic kidney disease-hemoglobin improved. Chronic kidney disease-improved  CPT CODE: 89381

## 2013-03-30 ENCOUNTER — Encounter: Payer: Self-pay | Admitting: Family

## 2013-03-30 ENCOUNTER — Telehealth: Payer: Self-pay | Admitting: Oncology

## 2013-03-30 ENCOUNTER — Non-Acute Institutional Stay (SKILLED_NURSING_FACILITY): Payer: Medicare Other | Admitting: Family

## 2013-03-30 DIAGNOSIS — D631 Anemia in chronic kidney disease: Secondary | ICD-10-CM

## 2013-03-30 DIAGNOSIS — N039 Chronic nephritic syndrome with unspecified morphologic changes: Secondary | ICD-10-CM

## 2013-03-30 DIAGNOSIS — N184 Chronic kidney disease, stage 4 (severe): Secondary | ICD-10-CM

## 2013-03-30 DIAGNOSIS — R609 Edema, unspecified: Secondary | ICD-10-CM

## 2013-03-30 DIAGNOSIS — C679 Malignant neoplasm of bladder, unspecified: Secondary | ICD-10-CM

## 2013-03-30 NOTE — Progress Notes (Signed)
Patient ID: [name], male   DOB: [birth year], 79 y.o.   MRN: [id number]  Date: 03/29/13 Facility: [facility]  Code Status:  DNR  Chief Complaint  Patient presents with  . Acute Visit    HPI: Nurse notified NP of weight gain of 27.6 lbs since 03/02/13. Pt Lasix d/c due to increasing BUN/Creatinine function 1/15 by nephrologist.  Pt reports SOB and pitting edema to BLE. Pt endorses edema is less prominent when legs are elevated. Pt denies CP, N/V; however reports an episode of diarrhea 03/29/13 that was relieved by Immodium.  Pt reports SOB is exacerbated with minimal exertion. Pt further reports stairs in his home and is skeptical if he can safely climb stairs in current condition. He further reports limited endurance in therapy d/t SOB.  Pt's wife reports she is unable to assist pt fully with ADL needs at home d/t her current medical condition.  Pt's wife expresses concern for pt safety if discharged home on 2/4 prior to receiving course of diuretics to decrease volume.  No further issues or concerns expressed at present time.     No Known Allergies   Medication List       This list is accurate as of: 03/30/13 12:07 PM.  Always use your most recent med list.               allopurinol 100 MG tablet  Commonly known as:  ZYLOPRIM  Take 100 mg by mouth daily.     amLODipine 10 MG tablet  Commonly known as:  NORVASC  Take 10 mg by mouth daily.     calcium-vitamin D 500-200 MG-UNIT per tablet  Commonly known as:  OSCAL WITH D  Take 1 tablet by mouth daily with breakfast.     cholecalciferol 1000 UNITS tablet  Commonly known as:  VITAMIN D  Take 1,000 Units by mouth daily.     hydrALAZINE 25 MG tablet  Commonly known as:  APRESOLINE  Take 25 mg by mouth 3 (three) times daily.     insulin glargine 100 UNIT/ML injection  Commonly known as:  LANTUS  Inject 9 Units into the skin daily.     insulin lispro 100 UNIT/ML injection  Commonly known as:  HUMALOG  Inject 3-4  Units into the skin 3 (three) times daily before meals. 3 units with breakfast 3 units with lunch and 4 units at supper     metoprolol succinate 100 MG 24 hr tablet  Commonly known as:  TOPROL-XL  Take 100 mg by mouth daily. Take with or immediately following a meal.     senna 8.6 MG Tabs tablet  Commonly known as:  SENOKOT  Take 1 tablet (8.6 mg total) by mouth 2 (two) times daily.     simvastatin 20 MG tablet  Commonly known as:  ZOCOR  Take 20 mg by mouth every morning.     tamsulosin 0.4 MG Caps capsule  Commonly known as:  FLOMAX  Take 0.4 mg by mouth daily.     vitamin B-12 1000 MCG tablet  Commonly known as:  CYANOCOBALAMIN  Take 1,000 mcg by mouth daily.         DATA REVIEWED    Laboratory Studies: Reviewed 03/23/13 Hemoglobin 8.8 Na 140, K 4.5, Cl 111, BUN 81, Cr 3.2, Ca 8.2 03/12/15  Hemoglobin 8.3 Na 138, K 5.5, Cl 109, BUN 98, Cr 3.4, Ca 8.2     Past Medical History  Diagnosis Date  . Hypertension   .  Heart murmur   . Gout     no attack in years  . Chronic kidney disease     RENAL INSUFFICIENCY  . Diabetes mellitus   . Bladder cancer   . Testicular cancer     1965  . Hypercholesteremia   . Osteoporosis   . Right foot drop     Past Surgical History  Procedure Laterality Date  . Orchiectomy  1965    right  . Turbt    . Fx femur  2006  . Cystoscopy w/ retrogrades  02/15/2011    Procedure: CYSTOSCOPY WITH RETROGRADE PYELOGRAM;  Surgeon: Franchot Gallo, MD;  Location: [facility];  Service: Urology;  Laterality: Left;  Cystoscopy, Transurethral Resection of Bladder Tumor, Left Retrograde Pyelograms    . Transurethral resection of bladder tumor  02/15/2011    Procedure: TRANSURETHRAL RESECTION OF BLADDER TUMOR (TURBT);  Surgeon: Franchot Gallo, MD;  Location: [facility];  Service: Urology;  Laterality: N/A;  . Cystoscopy w/ retrogrades Bilateral 04/06/2012    Procedure: CYSTOSCOPY WITH RETROGRADE PYELOGRAM;  Surgeon: Franchot Gallo, MD;   Location: [facility];  Service: Urology;  Laterality: Bilateral;  CYSTO, TURBT, BILATERAL RETROGRADES AND INSTILLATION Mitomycin C  . Transurethral resection of bladder tumor N/A 04/06/2012    Procedure: TRANSURETHRAL RESECTION OF BLADDER TUMOR (TURBT);  Surgeon: Franchot Gallo, MD;  Location: [facility];  Service: Urology;  Laterality: N/A;  . Tonsillectomy  as child  . Cataract extraction Right   . Transurethral resection of bladder tumor with gyrus (turbt-gyrus) N/A 12/31/2012    Procedure: TRANSURETHRAL RESECTION OF BLADDER TUMOR WITH GYRUS (TURBT-GYRUS);  Surgeon: Franchot Gallo, MD;  Location: [facility];  Service: Urology;  Laterality: N/A;  . Transurethral resection of prostate N/A 12/31/2012    Procedure: TRANSURETHRAL RESECTION OF THE PROSTATE WITH GYRUS INSTRUMENTS;  Surgeon: Franchot Gallo, MD;  Location: [facility];  Service: Urology;  Laterality: N/A;     Review of Systems  Respiratory: Positive for shortness of breath.   Cardiovascular: Positive for leg swelling.  Gastrointestinal: Negative.   Genitourinary: Negative.   Neurological: Positive for focal weakness and weakness.     Physical Exam Filed Vitals:   03/30/13 1153  BP: 138/78  Pulse: 66  Temp: 97.7 F (36.5 C)  Resp: 20  Weight: 166 lb 6.4 oz (75.479 kg)  SpO2: 98%   Body mass index is 23.22 kg/(m^2). Physical Exam  Constitutional: He is oriented to person, place, and time.  Cardiovascular: Normal rate and regular rhythm.   Pulses:      Radial pulses are 2+ on the right side, and 2+ on the left side.       Dorsalis pedis pulses are 1+ on the right side, and 1+ on the left side.  3+ pitting edema to BLE  Pulmonary/Chest: Effort normal and breath sounds normal.  Tachypnea w/ minimal exertion 24  Neurological: He is alert and oriented to person, place, and time. GCS eye subscore is 4. GCS verbal subscore is 5. GCS motor subscore is 6.    ASSESSMENT/PLAN Lasix 20 mg po daily x 3 days BMP, CBC, pro-BNP in 3  days Refer for f/u with cardiology Daily Weights Consider d/c in 3-4 days after diuresing with HHN, PT, OT -f/u with PCP/Cardiologist upon d/c    Follow up: prn

## 2013-03-30 NOTE — Telephone Encounter (Signed)
Rehab called and made appt for pt on 2/11 lab and MD , notified nurse

## 2013-04-05 ENCOUNTER — Telehealth: Payer: Self-pay | Admitting: Medical Oncology

## 2013-04-05 ENCOUNTER — Non-Acute Institutional Stay (SKILLED_NURSING_FACILITY): Payer: Medicare Other | Admitting: Internal Medicine

## 2013-04-05 DIAGNOSIS — N184 Chronic kidney disease, stage 4 (severe): Secondary | ICD-10-CM

## 2013-04-05 DIAGNOSIS — R635 Abnormal weight gain: Secondary | ICD-10-CM

## 2013-04-05 NOTE — Telephone Encounter (Signed)
Wife called stating she received phone call today reminding patient of appt sched with Dr Alen Blew for the 11th and she was not aware of this appt. States patient is at [facility] for rehab. Informed wife, per med rec note, rehab center called and scheduled this appt. Wife stated she wished to cancel this appt., states "he has other more pertinent issues right now and he is being followed by a urologist" and restates her wish to cancel the 02/11 appt with Dr. Alen Blew.  MD informed. Appts cancelled per wife's request.  LOV 12/09/2012

## 2013-04-06 ENCOUNTER — Encounter: Payer: Self-pay | Admitting: Physician Assistant

## 2013-04-06 ENCOUNTER — Ambulatory Visit (INDEPENDENT_AMBULATORY_CARE_PROVIDER_SITE_OTHER): Payer: Medicare Other | Admitting: Physician Assistant

## 2013-04-06 VITALS — BP 144/64 | HR 65 | Ht 71.0 in | Wt 167.4 lb

## 2013-04-06 DIAGNOSIS — I5032 Chronic diastolic (congestive) heart failure: Secondary | ICD-10-CM

## 2013-04-06 DIAGNOSIS — R609 Edema, unspecified: Secondary | ICD-10-CM

## 2013-04-06 DIAGNOSIS — N185 Chronic kidney disease, stage 5: Secondary | ICD-10-CM

## 2013-04-06 DIAGNOSIS — I10 Essential (primary) hypertension: Secondary | ICD-10-CM

## 2013-04-06 DIAGNOSIS — E785 Hyperlipidemia, unspecified: Secondary | ICD-10-CM

## 2013-04-06 NOTE — Patient Instructions (Addendum)
SCOTT WEAVER, PAC AGREES WITH THE PLAN FOR THE [place]; MAKE SURE YOU HAVE A LAB WORK IN ABOUT 5-7 DAYS FOR A BMET, BNP WITH THE RESULTS TO BE FAXED TO SCOTT WEAVER, [place]  IF YOUR CREATININE WORSENS YOU HAVE BEEN ADVISED TO FOLLOW UP WITH DR. Florene Glen  YOU WILL NEED TO FOLLOW UP WITH DR. VARANASI IN ABOUT 05/03/13 @ 11 am

## 2013-04-06 NOTE — Progress Notes (Signed)
[address], [place], [place]  [address] Phone: [phone] Fax:  [phone]  Date:  04/06/2013   ID:  [name], DOB Jul 16, 1934, MRN [id number]  PCP:  Dylan Solo, MD  Cardiologist:  Dr. Casandra Doffing   Nephrologist: Dr. Florene Glen Endocrinologist: Dr. Buddy Duty Urologist: Dr. Diona Fanti Gastroenterologist:  Dr. Watt Climes   History of Present Illness: [name] is a 79 y.o. male with a history of T2DM, HTN, HL, chronic LE edema, CKD stage IV, peripheral neuropathy, benign essential tremor, gout, B12 deficiency, colon polyps, bladder CA, testicular CA.  Last seen by Dr. Casandra Doffing in 01/2012.  Plan was for PRN follow up.  Patient was admitted 12/29-1/5 with weakness andhematuria. He was seen by urology and treated conservatively. He did require transfusion for anemia.  He had an elevated BNP. Echocardiogram demonstrated normal LV function with mild diastolic dysfunction. He was covered with antibiotics for questionable infiltrate on chest x-ray. He was diuresed with Lasix and did develop AKI the setting of CKD.  He is staying at [facility].  He was noted to have worsening edema recently and was started back on Lasix.  He was referred back to cardiology for follow up.    Adenosine Cardiolite (01/22/2006): Diaphragmatic attenuation, no ischemia, EF 56%; low risk. Echocardiogram (02/23/2013): Technically difficult, EF 45-62%, grade 1 diastolic dysfunction, mild MR.  Patient is here with his wife. She notes a 25 pound weight gain. His LE edema has worsened. According to the records I received from the nursing home, he has been placed on torsemide 20 mg to start today. He is very weak. He describes dyspnea with minimal activity. He is NYHA class III-IIIb. He sleeps on 2 pillows. He denies PND. He denies chest pain or syncope. He denies increased abdominal girth or scrotal edema. He does tell me that he was placed on antibiotics since arriving at the nursing home for  recurrent pneumonia. He has a nonproductive cough. He continues to have hematuria.  Recent Labs: 02/22/2013: ALT 38  02/23/2013: Pro B Natriuretic peptide (BNP) 5539.0*  02/28/2013: Creatinine 3.54*; Hemoglobin 10.0*; Potassium 4.2 03/23/2013:  Na 140, K 4.5, BUN 81, Cr 3.2, Hgb 8.8  Wt Readings from Last 3 Encounters:  03/30/13 166 lb 6.4 oz (75.479 kg)  03/02/13 135 lb 2 oz (61.291 kg)  01/02/13 146 lb 6.2 oz (66.4 kg)     Past Medical History  Diagnosis Date  . Hypertension   . Heart murmur   . Gout     no attack in years  . Chronic kidney disease     RENAL INSUFFICIENCY  . Diabetes mellitus   . Bladder cancer   . Testicular cancer     1965  . Hypercholesteremia   . Osteoporosis   . Right foot drop     Current Outpatient Prescriptions  Medication Sig Dispense Refill  . allopurinol (ZYLOPRIM) 100 MG tablet Take 100 mg by mouth daily.       Marland Kitchen amLODipine (NORVASC) 10 MG tablet Take 10 mg by mouth daily.      . calcium-vitamin D (OSCAL WITH D) 500-200 MG-UNIT per tablet Take 1 tablet by mouth daily with breakfast.      . cholecalciferol (VITAMIN D) 1000 UNITS tablet Take 1,000 Units by mouth daily.      . hydrALAZINE (APRESOLINE) 25 MG tablet Take 25 mg by mouth 3 (three) times daily.       . insulin glargine (LANTUS) 100 UNIT/ML injection Inject 9  Units into the skin daily.      . insulin lispro (HUMALOG) 100 UNIT/ML injection Inject 3-4 Units into the skin 3 (three) times daily before meals. 3 units with breakfast 3 units with lunch and 4 units at supper      . metoprolol succinate (TOPROL-XL) 100 MG 24 hr tablet Take 100 mg by mouth daily. Take with or immediately following a meal.      . senna (SENOKOT) 8.6 MG TABS tablet Take 1 tablet (8.6 mg total) by mouth 2 (two) times daily.  120 each  0  . simvastatin (ZOCOR) 20 MG tablet Take 20 mg by mouth every morning.       . tamsulosin (FLOMAX) 0.4 MG CAPS capsule Take 0.4 mg by mouth daily.       . vitamin B-12  (CYANOCOBALAMIN) 1000 MCG tablet Take 1,000 mcg by mouth daily.       No current facility-administered medications for this visit.    Allergies:   Review of patient's allergies indicates no known allergies.   Social History:  The patient  reports that he quit smoking about 45 years ago. His smoking use included Cigarettes. He smoked 0.00 packs per day. He has never used smokeless tobacco. He reports that he does not drink alcohol or use illicit drugs.   Family History:  The patient's family history includes Diabetes in his father and sister.   ROS:  Please see the history of present illness.      All other systems reviewed and negative.   PHYSICAL EXAM: VS:  BP 144/64  Pulse 65  Ht 5\' 11"  (1.803 m)  Wt 167 lb 6.4 oz (75.932 kg)  BMI 23.36 kg/m2 Chronically ill-appearing male sitting in a wheelchair in no acute distress HEENT: normal Neck: no JVDAt 90 Cardiac:  normal S1, S2; RRR; no murmur Lungs:  Decreased breath sounds bilaterally, no wheezing, rhonchi or rales Abd: soft, nontender, no hepatomegaly Ext: 22+ bilateral LE edemaBelow the knees Skin: warm and dry Neuro:  CNs 2-12 intact, no focal abnormalities noted  EKG:  NSR, HR 65, normal axis, no ST changes     ASSESSMENT AND PLAN:  1. Edema:  I suspect this is multifactorial. This is likely related to diastolic CHF, chronic kidney disease, deconditioning, anemia and protein calorie malnutrition (albumin 3).  Caution must be used with adjusting his diuretics in the setting of chronic kidney disease. He has recently been placed on torsemide 20 mg daily. Hopefully, this will provide some improvement. He should have a follow up basic metabolic panel as well as a BNP within the next week. I recommend that he return to see nephrology if his creatinine should worsen. 2. Chronic Diastolic CHF:  As noted, volume overload is related to multiple factors. His dyspnea is likely related to multiple factors as well including deconditioning and  anemia as well as recent pneumonia. Proceed with diuresis as noted. Continue to monitor renal function and potassium closely as noted above. 3. Chronic kidney disease:  Monitor renal function closely as noted. If his creatinine should increase, he will need early follow up with nephrology. 4. Hypertension:  Controlled. 5. Hyperlipidemia:  Continue statin. 6. Bladder CA:  Continue follow up with oncology and urology. 7. Recent pneumonia:  Continue antibiotics and follow up with primary care. 8. Disposition: Follow up with Dr. Irish Lack or me in 3-4 weeks.  Signed, Richardson Dopp, PA-C  04/06/2013 8:28 AM

## 2013-04-07 ENCOUNTER — Ambulatory Visit: Payer: Medicare Other | Admitting: Oncology

## 2013-04-07 ENCOUNTER — Other Ambulatory Visit: Payer: Medicare Other

## 2013-04-12 ENCOUNTER — Non-Acute Institutional Stay (SKILLED_NURSING_FACILITY): Payer: Medicare Other | Admitting: Internal Medicine

## 2013-04-12 DIAGNOSIS — N184 Chronic kidney disease, stage 4 (severe): Secondary | ICD-10-CM

## 2013-04-15 ENCOUNTER — Encounter: Payer: Self-pay | Admitting: *Deleted

## 2013-04-16 DIAGNOSIS — R635 Abnormal weight gain: Secondary | ICD-10-CM | POA: Insufficient documentation

## 2013-04-16 NOTE — Progress Notes (Signed)
Patient ID: [name], male   DOB: 05/06/1934, 79 y.o.   MRN: [id number]          PROGRESS NOTE  DATE: 04/05/2013    FACILITY:  [facility]  LEVEL OF CARE: SNF (31)  Acute Visit  CHIEF COMPLAINT:  Manage weight gain and chronic kidney disease.    HISTORY OF PRESENT ILLNESS: I was requested by the staff to assess the patient regarding above problem(s):  WEIGHT GAIN:  Patient's wife is very concerned that his state is declining and he has been gaining weight.  Patient is also complaining of exertional shortness of breath.  Patient does feel weak and does not feel he can go home, and wife also does not feel that she can take care of him at home.  The plan was to discharge him last week, which was held due to his unstable medical condition.    CHRONIC KIDNEY DISEASE: The patient's chronic kidney disease remains stable.  Patient denies increasing lower extremity swelling or confusion. Last BUN and creatinine are:   On 04/01/2013:  BUN 84, creatinine 3.5.  In 02/2013:  BUN 81, creatinine 3.2.    PAST MEDICAL HISTORY : Reviewed.  No changes.  CURRENT MEDICATIONS: Reviewed per [facility]  REVIEW OF SYSTEMS:  GENERAL: weight gain and fatigue; no change in appetite, no fever, chills or weakness RESPIRATORY: no cough, SOB, DOE,, wheezing, hemoptysis CARDIAC: no chest pain, edema or palpitations; exertional shortness of breath   GI: no abdominal pain, diarrhea, constipation, heart burn, nausea or vomiting  PHYSICAL EXAMINATION  GENERAL: no acute distress, normal body habitus EYES: conjunctivae normal, sclerae normal, normal eye lids NECK: supple, trachea midline, no neck masses, no thyroid tenderness, no thyromegaly LYMPHATICS: no LAN in the neck, no supraclavicular LAN RESPIRATORY: breathing is even & unlabored, BS CTAB CARDIAC: RRR, no murmur,no extra heart sounds EDEMA/VARICOSITIES:  +3 bilateral lower extremity edema    GI: abdomen soft, normal BS, no masses,  no tenderness, no hepatomegaly, no splenomegaly PSYCHIATRIC: the patient is alert & oriented to person, affect & behavior appropriate  ASSESSMENT/PLAN:  Weight gain.  Unstable problem.  Start Demadex 20 mg q.d.  Check BMP, TSH, and liver profile.    Chronic kidney disease.  Recheck BMP in two days since I am increasing Demadex.    I called and spoke with wife and discussed above problems and management plan.  She understands and agrees.  Patient at this point is not stable for discharge.    I also discussed with Physical Therapy.  According to Physical Therapy, the patient has been declining and is not safe to be discharged.  He needs continued physical therapy and medical stabilization.    CPT CODE: 42353      Miesha Bachmann Y [place], [place] [phone]

## 2013-04-19 NOTE — Progress Notes (Signed)
Patient ID: [name], male   DOB: 04/28/1934, 79 y.o.   MRN: [id number]          PROGRESS NOTE  DATE: 04/12/2013    FACILITY:  [facility]  LEVEL OF CARE: SNF (31)  Acute Visit  CHIEF COMPLAINT:  Manage chronic kidney disease.     HISTORY OF PRESENT ILLNESS: I was requested by the staff to assess the patient regarding above problem(s):  CHRONIC KIDNEY DISEASE: The patient's chronic kidney disease remains stable.  Patient denies increasing lower extremity swelling or confusion. Last BUN and creatinine are:   On 04/08/2013:  BUN 86, creatinine 3.5.  On 04/06/2013:  BUN 87, creatinine 4.  On 04/01/2013:  BUN 84, creatinine 3.5.    PAST MEDICAL HISTORY : Reviewed.  No changes.  CURRENT MEDICATIONS: Reviewed per [facility]  PHYSICAL EXAMINATION   VS:  T 96.6       P 63      RR 18      BP 128/62      WT (Lb) 160     GENERAL: no acute distress, normal body habitus RESPIRATORY: breathing is even & unlabored, BS CTAB CARDIAC: RRR, no murmur,no extra heart sounds EDEMA/VARICOSITIES:  +2 bilateral lower extremity edema      ASSESSMENT/PLAN:  Chronic kidney disease.   Renal functions improved.    CPT CODE: 97026     Gayani Y Das[place], [place] [phone]

## 2013-04-28 ENCOUNTER — Non-Acute Institutional Stay (SKILLED_NURSING_FACILITY): Payer: Medicare Other | Admitting: Internal Medicine

## 2013-04-28 DIAGNOSIS — N184 Chronic kidney disease, stage 4 (severe): Secondary | ICD-10-CM

## 2013-04-28 DIAGNOSIS — I509 Heart failure, unspecified: Secondary | ICD-10-CM

## 2013-04-28 DIAGNOSIS — I5030 Unspecified diastolic (congestive) heart failure: Secondary | ICD-10-CM

## 2013-04-28 DIAGNOSIS — R635 Abnormal weight gain: Secondary | ICD-10-CM

## 2013-04-28 NOTE — Progress Notes (Signed)
Patient ID: LEVONE OTTEN, male   DOB: 12/23/1934, 79 y.o.   MRN: [id number] This is an acute visit.  Level of care skilled.  [place].  Chief complaint-acute visit followup question weight gain.  History of present illness.  Patient is a pleasant 79 year old male with a complicated medical history including diastolic CHF complicated with significant renal insufficiency with a baseline creatinine appears in the mid threes.  Apparently earlier in his stay here he did develop some increased edema and significant weight gain and was started on low-dose diuretic currently on Demadex 20 a day.  As seen cardiology who has recommended caution with increasing his diuretics in light of his renal issues.  Clinically he appears to be stable he does not complain of any increased shortness of breath except with exertion which is not new.  I did review his weights and a weight today of 164.6 appears to be stable with his weights the last several days-it appears his baseline weight is in the low 160s highr [facility] history has been reviewed including progress note on 04/06/2013.  Medications have been reviewed per MAR.  Review of systems.  General denies any fever or chills.  Respiratory denies any increased shortness of breath from baseline does get definitely weak with significant exertion but he says this is not new.  Cardiac does not complaining of any chest pain does have a history of diastolic CHF.  Neurologic is not complaining of headache dizziness or syncopal-type feelings.  Physical exam.  He is afebrile pulse is 58 respirations 18 blood pressure 122/69-126/60-weight 164.6 this appears to be a gain of about 2 pounds the last 5 days.  In general this is a frail elderly male in no distress sitting comfortably in his chair.  His skin is warm and dry.  Chest is clear to auscultation with reduced breath sounds at the bases there is no labored  breathing.  Heart is regular rate and rhythm without murmur gallop or rub gas she continues with Eisai 2+ lower extremity edema bilaterally.  Abdomen soft nontender positive bowel sounds.  Labs.  04/08/2013.  Sodium 138 potassium 4.1 BUN 86 creatinine 3.5.  04/06/2013.  Sodium 136 potassium 4.4 BUN 87 creatinine 4.0.  WBC 8.2 hemoglobin 8.7 platelets 131.  Assessment and plan.  #1-weight gain-this does not appear dramatic-will have to be cautious increasing diuretic in light of his significantly impaired renal function as noted per his most recent cardiology notes-at this point we'll continue to monitor with daily weights-clinically he appears to be at baseline-he continues on Demadex 20 mg a day as well as Lopressor 100 mg daily-Will update a metabolic panel tomorrow so we did get a feel for what his current renal functionis--and if weight gain continue certainly will have to readdress this although I suspect there may be some scale variability here as well  Monitor closely we'll vital signs pulse ox every shift notify provider any increase chest congestion--sob-- or gain of weight greater than 2 pounds  [id number]

## 2013-05-03 ENCOUNTER — Ambulatory Visit: Payer: Medicare Other | Admitting: Interventional Cardiology

## 2013-05-03 ENCOUNTER — Non-Acute Institutional Stay (SKILLED_NURSING_FACILITY): Payer: Medicare Other | Admitting: Internal Medicine

## 2013-05-03 DIAGNOSIS — N184 Chronic kidney disease, stage 4 (severe): Secondary | ICD-10-CM

## 2013-05-03 DIAGNOSIS — N179 Acute kidney failure, unspecified: Secondary | ICD-10-CM

## 2013-05-03 DIAGNOSIS — R635 Abnormal weight gain: Secondary | ICD-10-CM

## 2013-05-03 DIAGNOSIS — C689 Malignant neoplasm of urinary organ, unspecified: Secondary | ICD-10-CM

## 2013-05-03 NOTE — Progress Notes (Signed)
Patient ID: [name], male   DOB: 08/30/1934, 79 y.o.   MRN: [id number] Facility: [facility] Chief Complaint: Renal insufficiency; edema History: This is a patient who has a known high grade papillary bladder urothelial tumor. He underwent trans urethral resection a month ago (Dr. Teresa Pelton). He has known  Severe CRF secondary to diabetic Nephropathy. Baseline Cr. Shown below. Labwork from 3/6 shows a Cr up to 4.7. I have been asked to look at him because of this. He is also supposed to be going home tomorrow. It would appear that his baseline CR. Is in the 3.5 range. Patient and his wife report he is voiding very little over the last 4 days. No clear dysuria or hematuria.    Results for MELDON, HANZLIK (MRN [id number]) as of 05/03/2013 18:59  Past Medical History  Diagnosis Date  . Hypertension   . Heart murmur   . Gout     no attack in years  . Chronic kidney disease     RENAL INSUFFICIENCY  . Diabetes mellitus   . Bladder cancer   . Testicular cancer     1965  . Hypercholesteremia   . Osteoporosis   . Right foot drop    Past Surgical History  Procedure Laterality Date  . Orchiectomy  1965    right  . Turbt    . Fx femur  2006  . Cystoscopy w/ retrogrades  02/15/2011    Procedure: CYSTOSCOPY WITH RETROGRADE PYELOGRAM;  Surgeon: Franchot Gallo, MD;  Location: [facility];  Service: Urology;  Laterality: Left;  Cystoscopy, Transurethral Resection of Bladder Tumor, Left Retrograde Pyelograms    . Transurethral resection of bladder tumor  02/15/2011    Procedure: TRANSURETHRAL RESECTION OF BLADDER TUMOR (TURBT);  Surgeon: Franchot Gallo, MD;  Location: [facility];  Service: Urology;  Laterality: N/A;  . Cystoscopy w/ retrogrades Bilateral 04/06/2012    Procedure: CYSTOSCOPY WITH RETROGRADE PYELOGRAM;  Surgeon: Franchot Gallo, MD;  Location: [facility];  Service: Urology;  Laterality: Bilateral;  CYSTO, TURBT, BILATERAL RETROGRADES AND INSTILLATION Mitomycin C  . Transurethral  resection of bladder tumor N/A 04/06/2012    Procedure: TRANSURETHRAL RESECTION OF BLADDER TUMOR (TURBT);  Surgeon: Franchot Gallo, MD;  Location: [facility];  Service: Urology;  Laterality: N/A;  . Tonsillectomy  as child  . Cataract extraction Right   . Transurethral resection of bladder tumor with gyrus (turbt-gyrus) N/A 12/31/2012    Procedure: TRANSURETHRAL RESECTION OF BLADDER TUMOR WITH GYRUS (TURBT-GYRUS);  Surgeon: Franchot Gallo, MD;  Location: [facility];  Service: Urology;  Laterality: N/A;  . Transurethral resection of prostate N/A 12/31/2012    Procedure: TRANSURETHRAL RESECTION OF THE PROSTATE WITH GYRUS INSTRUMENTS;  Surgeon: Franchot Gallo, MD;  Location: [facility];  Service: Urology;  Laterality: N/A;    Review of Systems General: his wife reports weight gain of 20-30 lbs Resp: SOB today at therapy CVS: no chest pian GI: no abdominal pain,  GU: He does not have the urge to void  Physical exam: General: Patient is alert and conversational;  Vitals: T-93.2 P50 BP 110/60 Resp: decreased a/e bilateral CVS: HS are very distant. JVP is elevated to the angle of the jaw.  ABD: Distended No tenderness  GU: There is suprapubic fullness but no tenderness.  Extremities: edema is extending into upper thighs. 3+ pitting. Edema extends into his abdominal flanks  Impression/plan 1) Acute on Chronic Renal Failure: We attempted to catheterize him and obtained only a few ml although he has just gone to  the bathroom. I have asked for urology  To review him on an urgent basis tomorrow to rule out an obstructive phenomenon. He is certainly not prerenal 2)Bradycardia: I have held the hydralazine and reduced the toprolol XL to 50. 3) lower extremity edema: He is on demadex 20. I would like urology to review the status of his tumor before further attempting to diurese him. 4) probable pleural effusions 5) His wife reports they have 11 stairs to navigate at home. No full bath on the main floor.   6) mildy hypothermic.not sure of cause. Not overtly infected.   Have arranged urgent urology review. Labs ordered for am BMP CBC with diff. I don't believe discharge tomorrow will be possible. His wife doesn't feel like She can manage. I have spoken to social services about delaying the discharge into next week.

## 2013-05-07 ENCOUNTER — Non-Acute Institutional Stay: Payer: Medicare Other | Admitting: Internal Medicine

## 2013-05-07 ENCOUNTER — Encounter: Payer: Self-pay | Admitting: Internal Medicine

## 2013-05-07 DIAGNOSIS — R635 Abnormal weight gain: Secondary | ICD-10-CM

## 2013-05-07 DIAGNOSIS — N184 Chronic kidney disease, stage 4 (severe): Secondary | ICD-10-CM

## 2013-05-07 DIAGNOSIS — R001 Bradycardia, unspecified: Secondary | ICD-10-CM | POA: Insufficient documentation

## 2013-05-07 NOTE — Assessment & Plan Note (Addendum)
->  30 lbs in 2 months -fluid overload likely secondary to worsening renal failure with recent recurrence of bladder cancer s/p TURBT 01/2013.  He also has diastolic dysfunction saw cardiology 2/10 and they rec f/u with Dr. Irish Lack in 2-3 weeks from 2/10.   Needs stat BMET and f/u with renal and urology

## 2013-05-07 NOTE — Progress Notes (Signed)
Subjective:    Patient ID: [name], male    DOB: [birth year], 79 y.o.   MRN: [id number]  HPI Comments: 79 y.o male PMH hypertension, heart murmur, gout, chronic kidney disease (stage 4, Nephrologist Dr. Erling Cruz x 7 years), recent history since 01/2013 of A/C CKD, Diabetes mellitus (no recent HA1C on file), testicular cancer 1965, HLD, osteoporosis, right foot drop, Bladder cancer  (s/p TURBT 2012 and 01/30/2013 and BCG x 2, Mitocycin-C treatments follows with Dr. Alen Blew oncology), DM type 1, anemia, right hydronephrosis, advanced DJD, advanced spinal stenosis, 01/2013 echo with EF 26-94% grade 1 diastolic dysfunction.   He was discharged from [facility] 02/22/13-03/01/13 with hematuria and suspected LLL pneumonia and was treated with Levaquin (course completed)  1) Weight gain-weight since 03/01/13 hospital discharge was 135 lbs on 04/28/13 was 164 lbs now on 05/07/13 was 172.2 lbs.  Patient c/o sob with exertion (exercise and walking to the bathroom), fatigue, decreased amount of urination though baseline is incontinent, diarrhea x 2 on 05/06/13, decreased appetite, leg edema worsening.  He denies abdominal swelling.  Patient overall does not feel like he is getting better. Reviewed last cardiology note 04/06/13 which stated caution with diuretic use since patient has CKD. At that time he was taking Toresmide 20 mg daily.    2) CKD IV: Creatinine on 02/28/13 was 3.54.  On 02/22/13 his Creatinine was 2.99.  His baseline was 2.1-2.3 but has been worsening      SH: lives at home with his wife, has 3 kids.   ROS: above and in section  Code status: DNR                                                                                         Review of Systems  Constitutional: Positive for appetite change and fatigue.  Respiratory: Positive for shortness of breath.   Cardiovascular: Positive for leg swelling. Negative for chest pain.  Gastrointestinal: Positive for diarrhea.  Genitourinary:  Negative for scrotal swelling.       +urinating less        Objective:   Physical Exam  Nursing note and vitals reviewed. Constitutional: He is oriented to person, place, and time. Vital signs are normal. He appears well-developed and well-nourished. He is cooperative.  HENT:  Head: Normocephalic and atraumatic.  Mouth/Throat: Oropharynx is clear and moist and mucous membranes are normal. No oropharyngeal exudate.  Eyes: Conjunctivae are normal. Right eye exhibits no discharge. Left eye exhibits no discharge. No scleral icterus.  Cardiovascular: Regular rhythm, S1 normal, S2 normal and normal heart sounds.  Bradycardia present.   No murmur heard. 2-3+ pitting edema lower extremities (feet until knee) with trace edema to thighs  Pulmonary/Chest: Effort normal and breath sounds normal.  Abdominal: Soft. Bowel sounds are normal. He exhibits no distension. There is no tenderness.  Musculoskeletal: He exhibits no edema.  Neurological: He is alert and oriented to person, place, and time.  Sitting in recliner   Skin: Skin is warm and dry. Bruising noted.  Scattered bruising to arms   Psychiatric: He has a normal mood and affect. His speech is normal and behavior  is normal. Judgment and thought content normal. Cognition and memory are normal.          Assessment & Plan:  1) Acute on chronic CKD 4: patient needs stat BMET, follow up with urology and renal (Dr. Florene Glen) ASAP, daily weights, caution with diuretics due to A/C CKD, limit fluid and sodium intake on cardiac diet  2) Weight gain: fluid overload likely secondary to worsening renal failure.    3) Bradycardia: asymptomatic: on 05/03/13 hydralazine was held and Toprol XL dose was decreased from 100 mg to 50 mg daily.    Reviewed with Dr. Hollace Kinnier

## 2013-05-07 NOTE — Assessment & Plan Note (Signed)
asymptomatic: on 05/03/13 hydralazine was held and Toprol XL dose was decreased from 100 mg to 50 mg daily.

## 2013-05-07 NOTE — Assessment & Plan Note (Addendum)
Likely acute on CKD stage IV. BL Cr 2.1-2.3 though worsening recently. 2/12 cr 3.5, 3/6 Cr 4.7, 3/10 Cr 4.6   -pt recently saw urology 3/10 consult states pt empties his bladder well, no immediate GU intervention keep 3/25 cysto appt with Dr. Shirley Muscat -patient needs BMET at f/u with Dr. Florene Glen, follow up with urology and renal (Dr. Florene Glen) ASAP, daily weights, caution with diuretics due to A/C CKD, limit fluid and sodium intake on cardiac diet -Of note pt saw Dr. Florene Glen 03/09/13 and sodium bicarb was stopped -Pt has appt 3/16 at 3:45 with Dr. Florene Glen

## 2013-05-07 NOTE — Assessment & Plan Note (Deleted)
fluid overload likely secondary to worsening renal failure with recent recurrence of bladder cancer s/p TURBT 01/2013  Needs stat BMET and f/u with renal and urology

## 2013-05-08 NOTE — Progress Notes (Signed)
[facility], DO  Pt seen and examined.  Discussed treatment plan with Dr. Aundra Dubin and I agree with her a/p as written.  My impression is that [name] weight gain is primarily due to worsening chronic kidney disease.  Dr. Aundra Dubin has kindly arranged an appt for him on Monday, 3/16 with Dr. Florene Glen for acute reassessment.  A f/u bmp has been ordered for Monday, as well.  He is off torsemide due to his declining renal function, was seen by urology who felt his edema was not acute obstruction and he also has an upcoming cystoscopy 3/25 with Dr. Diona Fanti.

## 2013-05-17 ENCOUNTER — Non-Acute Institutional Stay (SKILLED_NURSING_FACILITY): Payer: Medicare Other | Admitting: Internal Medicine

## 2013-05-17 ENCOUNTER — Encounter: Payer: Self-pay | Admitting: Internal Medicine

## 2013-05-17 DIAGNOSIS — N184 Chronic kidney disease, stage 4 (severe): Secondary | ICD-10-CM

## 2013-05-17 DIAGNOSIS — I10 Essential (primary) hypertension: Secondary | ICD-10-CM

## 2013-05-17 DIAGNOSIS — M109 Gout, unspecified: Secondary | ICD-10-CM

## 2013-05-17 DIAGNOSIS — I5030 Unspecified diastolic (congestive) heart failure: Secondary | ICD-10-CM

## 2013-05-17 DIAGNOSIS — C679 Malignant neoplasm of bladder, unspecified: Secondary | ICD-10-CM

## 2013-05-17 DIAGNOSIS — I509 Heart failure, unspecified: Secondary | ICD-10-CM

## 2013-05-17 NOTE — Progress Notes (Signed)
Patient ID: [name], male   DOB: 1934-09-04, 79 y.o.   MRN: [id number]   Facility: [facility] Level of care skilled.  Chief complaint discharge note    History: This is a patient who has a known high grade papillary bladder urothelial tumor. He underwent trans urethral resection a month ago (Dr. Teresa Pelton). He has known  Severe CRF secondary to diabetic Nephropathy His renal function has gradually deteriorated most recent creatinine I note on March 17 was 5.24-he apparently has been seen by nephrology as well as urology-.   he does have a history of diastolic CHF which is complicated with his renal issues-at one point his Demadex was held but has been restarted at 60 milligrams a day.  Apparently the family was consulted and patient is now under hospice care and will be followed by hospice at home.  I did speak with his wife in the emphasis now is on comfort.  His vital signs continued to be stable he has gained some weight apparently when he came here he was at 138 but his weight last week was up to the 170s and today is 180--however he is not complaining of any increased shortness of breath from baseline.  He also has anemia I suspect of chronic disease with most recent hemoglobin 7.9 which appears to be relatively baseline with what his labs have been in the past month.  He is a type I diabetic is on Lantus 9 units as well as NovoLog 3 units subcutaneous with breakfast and lunch--his blood sugars appear to be relatively stable ranging from the 90s to the low 100s in the morning the lowest I see. a 69 but this is certainly not, common At noon sugars range 76 to mid 100s again--average appears to be more in the low to mid 100 range.-Rarely is it below a  100  At 4 PM the lowest one I see is 67 again this is quite rare average again is in the lower 100s.  At night sugars are little higher averaging more in the mid 100s the lowest  See is 88  His vital signs appear to be stable  most recent blood pressure 115/52 with a pulse of 63 he was afebrile occasionally he will have bradycardic readings  and his Toprol has been reduced  He also has a history of gout he is on allopurinol and this has been apparently stable.  Regards to renal issues bladder cancer he is followed by urology again he is now under hospice services he does continue on Flomax.  .  .   .  Results for [name], [name] (MRN [id number]) as of 05/03/2013 18:59  Past Medical History   Diagnosis  Date   .  Hypertension    .  Heart murmur    .  Gout      no attack in years   .  Chronic kidney disease      RENAL INSUFFICIENCY   .  Diabetes mellitus    .  Bladder cancer    .  Testicular cancer      1965   .  Hypercholesteremia    .  Osteoporosis    .  Right foot drop     Past Surgical History   Procedure  Laterality  Date   .  Orchiectomy   1965     right   .  Turbt     .  Fx femur   2006   .  Cystoscopy  w/ retrogrades   02/15/2011     Procedure: CYSTOSCOPY WITH RETROGRADE PYELOGRAM; Surgeon: Franchot Gallo, MD; Location: [facility]; Service: Urology; Laterality: Left; Cystoscopy, Transurethral Resection of Bladder Tumor, Left Retrograde Pyelograms   .  Transurethral resection of bladder tumor   02/15/2011     Procedure: TRANSURETHRAL RESECTION OF BLADDER TUMOR (TURBT); Surgeon: Franchot Gallo, MD; Location: [facility]; Service: Urology; Laterality: N/A;   .  Cystoscopy w/ retrogrades  Bilateral  04/06/2012     Procedure: CYSTOSCOPY WITH RETROGRADE PYELOGRAM; Surgeon: Franchot Gallo, MD; Location: [facility]; Service: Urology; Laterality: Bilateral; CYSTO, TURBT, BILATERAL RETROGRADES AND INSTILLATION Mitomycin C   .  Transurethral resection of bladder tumor  N/A  04/06/2012     Procedure: TRANSURETHRAL RESECTION OF BLADDER TUMOR (TURBT); Surgeon: Franchot Gallo, MD; Location: [facility]; Service: Urology; Laterality: N/A;   .  Tonsillectomy   as child   .  Cataract extraction  Right    .   Transurethral resection of bladder tumor with gyrus (turbt-gyrus)  N/A  12/31/2012     Procedure: TRANSURETHRAL RESECTION OF BLADDER TUMOR WITH GYRUS (TURBT-GYRUS); Surgeon: Franchot Gallo, MD; Location: [facility]; Service: Urology; Laterality: N/A;   .  Transurethral resection of prostate  N/A  12/31/2012       Procedure: TRANSURETHRAL RESECTION OF THE PROSTATE WITH GYRUS INSTRUMENTS; Surgeon: Franchot Gallo, MD; Location: [facility]; Service: Urology; Laterality: N/A;   Review of Systems  General: No complaints of fever chills again he has had significant weight gain during his stay  Resp--no shortness of breath beyond baseline  CVS: no chest pian  GI: no abdominal pain,  GU: He does not have the urge to void Muscle skeletal does not complaining of joint pain currently.  Neurologic is not complaining of headache dizziness or syncopal type episodes.    Physical exam: Temperature 98.5 pulse 58 respirations 20 blood pressure 115/52-110/62 most recently  General: Pleasant frail elderly male in no distress sitting comfortably in his wheelchair looking forward to going home   Resp: decreased a/e bilateral --but no congestion noted no labored breathing CVS: Regular rate and rhythm borderline bradycardic in the high 50s.  ABD: soft--No tenderness--with positive bowel sounds  GU: T no tenderness.  Extremities: General frailty ambulates in a wheelchair-continues with 2-3+ edema lower extremities   Labs.  05/11/2013.  WBC 9.5 hemoglobin 8.2 platelets 76,000.  Sodium 137 potassium 4.7 BUN 158 creatinine 5.24   Impression/plan  1) Acute on Chronic Renal Failure: --This is followed by nephrology-creatinine is worsening complicated with his history of diastolic CHF with need for diuretic-patient is going to be followed by hospice at home with emphasis on comfort-apparently updated appointments have been arranged with nephrology and urology as needed-clinically he appears to be  comfortable  2-history of bladder carcinoma-again this will be followed by urology per discussion with his wife-he is on Flomax.   #3 diastolic CHF-as stated above complicated with his renal issues currently on Demadex 60 mg a day it slowly gaining weight but this is quite involved with his worsening creatinine-emphasis is on comfort at this point he appears to be comfortable and will be followed by hospice as well as nephrology urology as needed  #4-hypertension this appears to be stable on hydralazine Norvasc Toprol dose was reduced secondary to bradycardia #5-diabetes apparently type I-he is on Lantus as well as NovoLog twice a day-this appears to be stable I did speak with his wife who is apparently quite familiar with managing this she is a retired  nurse--   [id number] note greater than 30 minutes spent preparing this discharge summary-of note greater than 50% of time spent coordinating plan of care

## 2013-05-18 ENCOUNTER — Telehealth: Payer: Self-pay | Admitting: Interventional Cardiology

## 2013-05-18 NOTE — Telephone Encounter (Signed)
Per Dr. Irish Lack pt should stop metoprolol and hydralazine. Patty at [facility] notified.

## 2013-05-18 NOTE — Telephone Encounter (Signed)
New message   Nurse Vara Guardian with [facility] of [facility] called states the pt is on an excessive amount of BP med's. Pt is now in hospice, she states she was doing an assessment and found that the pt's BP is running low 88/42 pulse 32. She states that the pt is on three different Hypertension medications Norvasc, Toper al XL and Hydralazine. She states that her main reason for calling is to alleviate the pt from taking 1 or more of the Hypertension medications. Please call

## 2013-05-18 NOTE — Telephone Encounter (Signed)
D/C hydralazine and toprol

## 2013-05-18 NOTE — Telephone Encounter (Signed)
To Dr. Varanasi, please advise.  

## 2013-05-19 ENCOUNTER — Telehealth: Payer: Self-pay | Admitting: Interventional Cardiology

## 2013-05-19 NOTE — Telephone Encounter (Signed)
New message    Pt has bladder cancer.  Hospice nurse said his norvasc has been held. Now his bp is 90/50 and pulse is 40.  Talk to someone regarding bp and pulse.

## 2013-05-19 NOTE — Telephone Encounter (Signed)
Lm for [name] at hospice to call asap because I spoke with her yesterday and I told to that pt is to d/c metoprolol and hydralazine.

## 2013-05-19 NOTE — Telephone Encounter (Signed)
Spoke with [name] at hospice and pt was told to hold norvasc per on-call doctor until pt was reassessed this am. Pt did take metoprolol and hydralazine yesterday, but as of today pt is off those two medications.  Bp this am before medications was 90/50. Should pt continue norvasc?

## 2013-05-20 NOTE — Telephone Encounter (Signed)
Only continue norvasc if BP > 140/90

## 2013-05-21 NOTE — Telephone Encounter (Signed)
Spoke with Patty at [facility] and pt passed away on 2022-06-23. FYI to Dr. Irish Lack.

## 2013-05-21 NOTE — Telephone Encounter (Signed)
lmtrc

## 2013-05-26 DEATH — deceased

## 2016-03-07 ENCOUNTER — Other Ambulatory Visit: Payer: Self-pay | Admitting: Nurse Practitioner
